# Patient Record
Sex: Female | Born: 1965 | Race: White | Hispanic: No | Marital: Married | Smoking: Former smoker
Health system: Southern US, Community
[De-identification: ages and names within clinical notes are randomized; demographics above are authoritative.]

## PROBLEM LIST (undated history)

## (undated) DIAGNOSIS — M1990 Unspecified osteoarthritis, unspecified site: Secondary | ICD-10-CM

## (undated) DIAGNOSIS — M7918 Myalgia, other site: Secondary | ICD-10-CM

## (undated) DIAGNOSIS — Z8719 Personal history of other diseases of the digestive system: Secondary | ICD-10-CM

## (undated) DIAGNOSIS — K219 Gastro-esophageal reflux disease without esophagitis: Secondary | ICD-10-CM

## (undated) DIAGNOSIS — D259 Leiomyoma of uterus, unspecified: Secondary | ICD-10-CM

## (undated) DIAGNOSIS — F419 Anxiety disorder, unspecified: Secondary | ICD-10-CM

## (undated) DIAGNOSIS — T7840XA Allergy, unspecified, initial encounter: Secondary | ICD-10-CM

## (undated) DIAGNOSIS — I10 Essential (primary) hypertension: Secondary | ICD-10-CM

## (undated) DIAGNOSIS — N3010 Interstitial cystitis (chronic) without hematuria: Secondary | ICD-10-CM

## (undated) DIAGNOSIS — M329 Systemic lupus erythematosus, unspecified: Secondary | ICD-10-CM

## (undated) DIAGNOSIS — IMO0002 Reserved for concepts with insufficient information to code with codable children: Secondary | ICD-10-CM

## (undated) DIAGNOSIS — M797 Fibromyalgia: Secondary | ICD-10-CM

## (undated) DIAGNOSIS — E785 Hyperlipidemia, unspecified: Secondary | ICD-10-CM

## (undated) HISTORY — DX: Hyperlipidemia, unspecified: E78.5

## (undated) HISTORY — PX: ABDOMINAL HYSTERECTOMY: SHX81

## (undated) HISTORY — DX: Allergy, unspecified, initial encounter: T78.40XA

## (undated) HISTORY — PX: FRACTURE SURGERY: SHX138

## (undated) HISTORY — PX: WISDOM TOOTH EXTRACTION: SHX21

## (undated) HISTORY — PX: COLONOSCOPY WITH ESOPHAGOGASTRODUODENOSCOPY (EGD): SHX5779

## (undated) HISTORY — PX: CHOLECYSTECTOMY: SHX55

## (undated) HISTORY — PX: THUMB ARTHROSCOPY: SHX2509

## (undated) HISTORY — PX: ULNAR SHORTENING WITH BONE GRAFT: SHX6330

---

## 2011-12-29 DIAGNOSIS — E559 Vitamin D deficiency, unspecified: Secondary | ICD-10-CM | POA: Insufficient documentation

## 2014-09-01 DIAGNOSIS — M329 Systemic lupus erythematosus, unspecified: Secondary | ICD-10-CM | POA: Insufficient documentation

## 2014-09-01 DIAGNOSIS — M797 Fibromyalgia: Secondary | ICD-10-CM | POA: Insufficient documentation

## 2014-09-01 DIAGNOSIS — M159 Polyosteoarthritis, unspecified: Secondary | ICD-10-CM | POA: Insufficient documentation

## 2014-09-01 DIAGNOSIS — L719 Rosacea, unspecified: Secondary | ICD-10-CM | POA: Insufficient documentation

## 2016-11-01 DIAGNOSIS — M21931 Unspecified acquired deformity of right forearm: Secondary | ICD-10-CM | POA: Diagnosis not present

## 2016-11-01 DIAGNOSIS — M85441 Solitary bone cyst, right hand: Secondary | ICD-10-CM | POA: Diagnosis not present

## 2016-11-01 DIAGNOSIS — M24831 Other specific joint derangements of right wrist, not elsewhere classified: Secondary | ICD-10-CM | POA: Diagnosis not present

## 2016-11-01 DIAGNOSIS — M659 Synovitis and tenosynovitis, unspecified: Secondary | ICD-10-CM | POA: Diagnosis not present

## 2016-11-01 DIAGNOSIS — M67431 Ganglion, right wrist: Secondary | ICD-10-CM | POA: Diagnosis not present

## 2016-11-01 DIAGNOSIS — M21831 Other specified acquired deformities of right forearm: Secondary | ICD-10-CM | POA: Diagnosis not present

## 2016-11-01 DIAGNOSIS — M24131 Other articular cartilage disorders, right wrist: Secondary | ICD-10-CM | POA: Diagnosis not present

## 2016-11-01 DIAGNOSIS — M65841 Other synovitis and tenosynovitis, right hand: Secondary | ICD-10-CM | POA: Diagnosis not present

## 2016-11-01 DIAGNOSIS — S63591D Other specified sprain of right wrist, subsequent encounter: Secondary | ICD-10-CM | POA: Diagnosis not present

## 2016-11-01 DIAGNOSIS — G8918 Other acute postprocedural pain: Secondary | ICD-10-CM | POA: Diagnosis not present

## 2016-11-01 DIAGNOSIS — M25331 Other instability, right wrist: Secondary | ICD-10-CM | POA: Diagnosis not present

## 2016-11-14 DIAGNOSIS — M65841 Other synovitis and tenosynovitis, right hand: Secondary | ICD-10-CM | POA: Diagnosis not present

## 2016-11-14 DIAGNOSIS — Z4789 Encounter for other orthopedic aftercare: Secondary | ICD-10-CM | POA: Diagnosis not present

## 2016-11-14 DIAGNOSIS — M24831 Other specific joint derangements of right wrist, not elsewhere classified: Secondary | ICD-10-CM | POA: Diagnosis not present

## 2016-11-17 DIAGNOSIS — M3210 Systemic lupus erythematosus, organ or system involvement unspecified: Secondary | ICD-10-CM | POA: Diagnosis not present

## 2016-11-17 DIAGNOSIS — Z79899 Other long term (current) drug therapy: Secondary | ICD-10-CM | POA: Diagnosis not present

## 2016-11-28 DIAGNOSIS — Z4789 Encounter for other orthopedic aftercare: Secondary | ICD-10-CM | POA: Diagnosis not present

## 2016-11-28 DIAGNOSIS — M25831 Other specified joint disorders, right wrist: Secondary | ICD-10-CM | POA: Diagnosis not present

## 2016-11-28 DIAGNOSIS — M65841 Other synovitis and tenosynovitis, right hand: Secondary | ICD-10-CM | POA: Diagnosis not present

## 2016-12-05 DIAGNOSIS — I10 Essential (primary) hypertension: Secondary | ICD-10-CM | POA: Diagnosis not present

## 2016-12-05 DIAGNOSIS — M25831 Other specified joint disorders, right wrist: Secondary | ICD-10-CM | POA: Diagnosis not present

## 2016-12-05 DIAGNOSIS — E782 Mixed hyperlipidemia: Secondary | ICD-10-CM | POA: Diagnosis not present

## 2016-12-05 DIAGNOSIS — Z130 Encounter for screening for diseases of the blood and blood-forming organs and certain disorders involving the immune mechanism: Secondary | ICD-10-CM | POA: Diagnosis not present

## 2016-12-05 DIAGNOSIS — E1165 Type 2 diabetes mellitus with hyperglycemia: Secondary | ICD-10-CM | POA: Diagnosis not present

## 2016-12-05 DIAGNOSIS — Z0189 Encounter for other specified special examinations: Secondary | ICD-10-CM | POA: Diagnosis not present

## 2016-12-05 DIAGNOSIS — Z0000 Encounter for general adult medical examination without abnormal findings: Secondary | ICD-10-CM | POA: Diagnosis not present

## 2016-12-05 DIAGNOSIS — E790 Hyperuricemia without signs of inflammatory arthritis and tophaceous disease: Secondary | ICD-10-CM | POA: Diagnosis not present

## 2016-12-05 DIAGNOSIS — E559 Vitamin D deficiency, unspecified: Secondary | ICD-10-CM | POA: Diagnosis not present

## 2016-12-05 DIAGNOSIS — E039 Hypothyroidism, unspecified: Secondary | ICD-10-CM | POA: Diagnosis not present

## 2016-12-14 DIAGNOSIS — M25831 Other specified joint disorders, right wrist: Secondary | ICD-10-CM | POA: Diagnosis not present

## 2016-12-19 DIAGNOSIS — M25831 Other specified joint disorders, right wrist: Secondary | ICD-10-CM | POA: Diagnosis not present

## 2016-12-22 DIAGNOSIS — M25831 Other specified joint disorders, right wrist: Secondary | ICD-10-CM | POA: Diagnosis not present

## 2016-12-29 DIAGNOSIS — M25831 Other specified joint disorders, right wrist: Secondary | ICD-10-CM | POA: Diagnosis not present

## 2017-01-06 DIAGNOSIS — S52691K Other fracture of lower end of right ulna, subsequent encounter for closed fracture with nonunion: Secondary | ICD-10-CM | POA: Diagnosis not present

## 2017-01-11 DIAGNOSIS — M25831 Other specified joint disorders, right wrist: Secondary | ICD-10-CM | POA: Diagnosis not present

## 2017-01-18 DIAGNOSIS — M25831 Other specified joint disorders, right wrist: Secondary | ICD-10-CM | POA: Diagnosis not present

## 2017-01-24 DIAGNOSIS — M797 Fibromyalgia: Secondary | ICD-10-CM | POA: Diagnosis not present

## 2017-01-24 DIAGNOSIS — M25531 Pain in right wrist: Secondary | ICD-10-CM | POA: Diagnosis not present

## 2017-01-24 DIAGNOSIS — M25431 Effusion, right wrist: Secondary | ICD-10-CM | POA: Diagnosis not present

## 2017-01-24 DIAGNOSIS — J302 Other seasonal allergic rhinitis: Secondary | ICD-10-CM | POA: Diagnosis not present

## 2017-01-24 DIAGNOSIS — M6281 Muscle weakness (generalized): Secondary | ICD-10-CM | POA: Diagnosis not present

## 2017-01-24 DIAGNOSIS — M791 Myalgia: Secondary | ICD-10-CM | POA: Diagnosis not present

## 2017-01-24 DIAGNOSIS — M25631 Stiffness of right wrist, not elsewhere classified: Secondary | ICD-10-CM | POA: Diagnosis not present

## 2017-01-24 DIAGNOSIS — M328 Other forms of systemic lupus erythematosus: Secondary | ICD-10-CM | POA: Diagnosis not present

## 2017-01-27 DIAGNOSIS — M25631 Stiffness of right wrist, not elsewhere classified: Secondary | ICD-10-CM | POA: Diagnosis not present

## 2017-01-27 DIAGNOSIS — M25431 Effusion, right wrist: Secondary | ICD-10-CM | POA: Diagnosis not present

## 2017-01-27 DIAGNOSIS — M25531 Pain in right wrist: Secondary | ICD-10-CM | POA: Diagnosis not present

## 2017-01-27 DIAGNOSIS — M6281 Muscle weakness (generalized): Secondary | ICD-10-CM | POA: Diagnosis not present

## 2017-02-03 DIAGNOSIS — M25631 Stiffness of right wrist, not elsewhere classified: Secondary | ICD-10-CM | POA: Diagnosis not present

## 2017-02-03 DIAGNOSIS — M25531 Pain in right wrist: Secondary | ICD-10-CM | POA: Diagnosis not present

## 2017-02-03 DIAGNOSIS — M25431 Effusion, right wrist: Secondary | ICD-10-CM | POA: Diagnosis not present

## 2017-02-03 DIAGNOSIS — M6281 Muscle weakness (generalized): Secondary | ICD-10-CM | POA: Diagnosis not present

## 2017-02-07 DIAGNOSIS — M25631 Stiffness of right wrist, not elsewhere classified: Secondary | ICD-10-CM | POA: Diagnosis not present

## 2017-02-07 DIAGNOSIS — M6281 Muscle weakness (generalized): Secondary | ICD-10-CM | POA: Diagnosis not present

## 2017-02-07 DIAGNOSIS — M25431 Effusion, right wrist: Secondary | ICD-10-CM | POA: Diagnosis not present

## 2017-02-07 DIAGNOSIS — M25531 Pain in right wrist: Secondary | ICD-10-CM | POA: Diagnosis not present

## 2017-02-09 DIAGNOSIS — M6281 Muscle weakness (generalized): Secondary | ICD-10-CM | POA: Diagnosis not present

## 2017-02-09 DIAGNOSIS — M25631 Stiffness of right wrist, not elsewhere classified: Secondary | ICD-10-CM | POA: Diagnosis not present

## 2017-02-09 DIAGNOSIS — M25431 Effusion, right wrist: Secondary | ICD-10-CM | POA: Diagnosis not present

## 2017-02-09 DIAGNOSIS — M25531 Pain in right wrist: Secondary | ICD-10-CM | POA: Diagnosis not present

## 2017-02-14 DIAGNOSIS — M25631 Stiffness of right wrist, not elsewhere classified: Secondary | ICD-10-CM | POA: Diagnosis not present

## 2017-02-14 DIAGNOSIS — M6281 Muscle weakness (generalized): Secondary | ICD-10-CM | POA: Diagnosis not present

## 2017-02-14 DIAGNOSIS — M25431 Effusion, right wrist: Secondary | ICD-10-CM | POA: Diagnosis not present

## 2017-02-14 DIAGNOSIS — M25531 Pain in right wrist: Secondary | ICD-10-CM | POA: Diagnosis not present

## 2017-02-15 DIAGNOSIS — M25532 Pain in left wrist: Secondary | ICD-10-CM | POA: Diagnosis not present

## 2017-02-15 DIAGNOSIS — M25832 Other specified joint disorders, left wrist: Secondary | ICD-10-CM | POA: Diagnosis not present

## 2017-02-15 DIAGNOSIS — M25831 Other specified joint disorders, right wrist: Secondary | ICD-10-CM | POA: Diagnosis not present

## 2017-02-16 DIAGNOSIS — R1013 Epigastric pain: Secondary | ICD-10-CM | POA: Diagnosis not present

## 2017-02-16 DIAGNOSIS — M25531 Pain in right wrist: Secondary | ICD-10-CM | POA: Diagnosis not present

## 2017-02-16 DIAGNOSIS — M25431 Effusion, right wrist: Secondary | ICD-10-CM | POA: Diagnosis not present

## 2017-02-16 DIAGNOSIS — M6281 Muscle weakness (generalized): Secondary | ICD-10-CM | POA: Diagnosis not present

## 2017-02-16 DIAGNOSIS — M25631 Stiffness of right wrist, not elsewhere classified: Secondary | ICD-10-CM | POA: Diagnosis not present

## 2017-02-17 DIAGNOSIS — Z131 Encounter for screening for diabetes mellitus: Secondary | ICD-10-CM | POA: Diagnosis not present

## 2017-02-17 DIAGNOSIS — Z79899 Other long term (current) drug therapy: Secondary | ICD-10-CM | POA: Diagnosis not present

## 2017-02-17 DIAGNOSIS — M3210 Systemic lupus erythematosus, organ or system involvement unspecified: Secondary | ICD-10-CM | POA: Diagnosis not present

## 2017-02-17 DIAGNOSIS — Z13220 Encounter for screening for lipoid disorders: Secondary | ICD-10-CM | POA: Diagnosis not present

## 2017-02-21 DIAGNOSIS — M25531 Pain in right wrist: Secondary | ICD-10-CM | POA: Diagnosis not present

## 2017-02-21 DIAGNOSIS — M6281 Muscle weakness (generalized): Secondary | ICD-10-CM | POA: Diagnosis not present

## 2017-02-21 DIAGNOSIS — M25431 Effusion, right wrist: Secondary | ICD-10-CM | POA: Diagnosis not present

## 2017-02-21 DIAGNOSIS — M25631 Stiffness of right wrist, not elsewhere classified: Secondary | ICD-10-CM | POA: Diagnosis not present

## 2017-02-22 ENCOUNTER — Other Ambulatory Visit: Payer: Self-pay | Admitting: Internal Medicine

## 2017-02-22 DIAGNOSIS — Z1231 Encounter for screening mammogram for malignant neoplasm of breast: Secondary | ICD-10-CM

## 2017-02-23 DIAGNOSIS — M6281 Muscle weakness (generalized): Secondary | ICD-10-CM | POA: Diagnosis not present

## 2017-02-23 DIAGNOSIS — M25431 Effusion, right wrist: Secondary | ICD-10-CM | POA: Diagnosis not present

## 2017-02-23 DIAGNOSIS — M25531 Pain in right wrist: Secondary | ICD-10-CM | POA: Diagnosis not present

## 2017-02-23 DIAGNOSIS — M25631 Stiffness of right wrist, not elsewhere classified: Secondary | ICD-10-CM | POA: Diagnosis not present

## 2017-03-01 DIAGNOSIS — E2839 Other primary ovarian failure: Secondary | ICD-10-CM | POA: Diagnosis not present

## 2017-03-01 DIAGNOSIS — I781 Nevus, non-neoplastic: Secondary | ICD-10-CM | POA: Diagnosis not present

## 2017-03-01 DIAGNOSIS — L578 Other skin changes due to chronic exposure to nonionizing radiation: Secondary | ICD-10-CM | POA: Diagnosis not present

## 2017-03-01 DIAGNOSIS — M3210 Systemic lupus erythematosus, organ or system involvement unspecified: Secondary | ICD-10-CM | POA: Diagnosis not present

## 2017-03-01 DIAGNOSIS — L930 Discoid lupus erythematosus: Secondary | ICD-10-CM | POA: Diagnosis not present

## 2017-03-01 DIAGNOSIS — Z7952 Long term (current) use of systemic steroids: Secondary | ICD-10-CM | POA: Diagnosis not present

## 2017-03-02 DIAGNOSIS — M25631 Stiffness of right wrist, not elsewhere classified: Secondary | ICD-10-CM | POA: Diagnosis not present

## 2017-03-02 DIAGNOSIS — M25431 Effusion, right wrist: Secondary | ICD-10-CM | POA: Diagnosis not present

## 2017-03-02 DIAGNOSIS — M6281 Muscle weakness (generalized): Secondary | ICD-10-CM | POA: Diagnosis not present

## 2017-03-02 DIAGNOSIS — M25531 Pain in right wrist: Secondary | ICD-10-CM | POA: Diagnosis not present

## 2017-03-03 ENCOUNTER — Ambulatory Visit: Payer: Self-pay

## 2017-03-07 DIAGNOSIS — M6281 Muscle weakness (generalized): Secondary | ICD-10-CM | POA: Diagnosis not present

## 2017-03-07 DIAGNOSIS — M25431 Effusion, right wrist: Secondary | ICD-10-CM | POA: Diagnosis not present

## 2017-03-07 DIAGNOSIS — M25531 Pain in right wrist: Secondary | ICD-10-CM | POA: Diagnosis not present

## 2017-03-07 DIAGNOSIS — M25631 Stiffness of right wrist, not elsewhere classified: Secondary | ICD-10-CM | POA: Diagnosis not present

## 2017-03-08 ENCOUNTER — Ambulatory Visit
Admission: RE | Admit: 2017-03-08 | Discharge: 2017-03-08 | Disposition: A | Discharge status: Home / Self Care | Payer: BLUE CROSS/BLUE SHIELD | Source: Ambulatory Visit | Attending: Internal Medicine | Admitting: Internal Medicine

## 2017-03-08 DIAGNOSIS — Z1231 Encounter for screening mammogram for malignant neoplasm of breast: Secondary | ICD-10-CM

## 2017-03-09 DIAGNOSIS — M25431 Effusion, right wrist: Secondary | ICD-10-CM | POA: Diagnosis not present

## 2017-03-09 DIAGNOSIS — M6281 Muscle weakness (generalized): Secondary | ICD-10-CM | POA: Diagnosis not present

## 2017-03-09 DIAGNOSIS — M25631 Stiffness of right wrist, not elsewhere classified: Secondary | ICD-10-CM | POA: Diagnosis not present

## 2017-03-09 DIAGNOSIS — M25531 Pain in right wrist: Secondary | ICD-10-CM | POA: Diagnosis not present

## 2017-03-10 DIAGNOSIS — K210 Gastro-esophageal reflux disease with esophagitis: Secondary | ICD-10-CM | POA: Diagnosis not present

## 2017-03-10 DIAGNOSIS — M1990 Unspecified osteoarthritis, unspecified site: Secondary | ICD-10-CM | POA: Diagnosis not present

## 2017-03-10 DIAGNOSIS — K222 Esophageal obstruction: Secondary | ICD-10-CM | POA: Diagnosis not present

## 2017-03-10 DIAGNOSIS — N3010 Interstitial cystitis (chronic) without hematuria: Secondary | ICD-10-CM | POA: Diagnosis not present

## 2017-03-10 DIAGNOSIS — M797 Fibromyalgia: Secondary | ICD-10-CM | POA: Diagnosis not present

## 2017-03-10 DIAGNOSIS — M329 Systemic lupus erythematosus, unspecified: Secondary | ICD-10-CM | POA: Diagnosis not present

## 2017-03-10 DIAGNOSIS — R12 Heartburn: Secondary | ICD-10-CM | POA: Diagnosis not present

## 2017-03-10 DIAGNOSIS — R1011 Right upper quadrant pain: Secondary | ICD-10-CM | POA: Diagnosis not present

## 2017-03-10 DIAGNOSIS — I10 Essential (primary) hypertension: Secondary | ICD-10-CM | POA: Diagnosis not present

## 2017-03-10 DIAGNOSIS — K208 Other esophagitis: Secondary | ICD-10-CM | POA: Diagnosis not present

## 2017-03-10 DIAGNOSIS — K219 Gastro-esophageal reflux disease without esophagitis: Secondary | ICD-10-CM | POA: Diagnosis not present

## 2017-03-10 DIAGNOSIS — Z90710 Acquired absence of both cervix and uterus: Secondary | ICD-10-CM | POA: Diagnosis not present

## 2017-03-10 DIAGNOSIS — Z79899 Other long term (current) drug therapy: Secondary | ICD-10-CM | POA: Diagnosis not present

## 2017-03-10 DIAGNOSIS — L719 Rosacea, unspecified: Secondary | ICD-10-CM | POA: Diagnosis not present

## 2017-03-10 DIAGNOSIS — K449 Diaphragmatic hernia without obstruction or gangrene: Secondary | ICD-10-CM | POA: Diagnosis not present

## 2017-03-14 DIAGNOSIS — M25431 Effusion, right wrist: Secondary | ICD-10-CM | POA: Diagnosis not present

## 2017-03-14 DIAGNOSIS — M6281 Muscle weakness (generalized): Secondary | ICD-10-CM | POA: Diagnosis not present

## 2017-03-14 DIAGNOSIS — M25631 Stiffness of right wrist, not elsewhere classified: Secondary | ICD-10-CM | POA: Diagnosis not present

## 2017-03-14 DIAGNOSIS — M25531 Pain in right wrist: Secondary | ICD-10-CM | POA: Diagnosis not present

## 2017-03-21 DIAGNOSIS — M25631 Stiffness of right wrist, not elsewhere classified: Secondary | ICD-10-CM | POA: Diagnosis not present

## 2017-03-21 DIAGNOSIS — M25431 Effusion, right wrist: Secondary | ICD-10-CM | POA: Diagnosis not present

## 2017-03-21 DIAGNOSIS — M25531 Pain in right wrist: Secondary | ICD-10-CM | POA: Diagnosis not present

## 2017-03-21 DIAGNOSIS — M6281 Muscle weakness (generalized): Secondary | ICD-10-CM | POA: Diagnosis not present

## 2017-03-23 DIAGNOSIS — M25531 Pain in right wrist: Secondary | ICD-10-CM | POA: Diagnosis not present

## 2017-03-23 DIAGNOSIS — M25631 Stiffness of right wrist, not elsewhere classified: Secondary | ICD-10-CM | POA: Diagnosis not present

## 2017-03-23 DIAGNOSIS — M6281 Muscle weakness (generalized): Secondary | ICD-10-CM | POA: Diagnosis not present

## 2017-03-23 DIAGNOSIS — M25431 Effusion, right wrist: Secondary | ICD-10-CM | POA: Diagnosis not present

## 2017-03-28 DIAGNOSIS — M6281 Muscle weakness (generalized): Secondary | ICD-10-CM | POA: Diagnosis not present

## 2017-03-28 DIAGNOSIS — M25431 Effusion, right wrist: Secondary | ICD-10-CM | POA: Diagnosis not present

## 2017-03-28 DIAGNOSIS — M25531 Pain in right wrist: Secondary | ICD-10-CM | POA: Diagnosis not present

## 2017-03-28 DIAGNOSIS — M25631 Stiffness of right wrist, not elsewhere classified: Secondary | ICD-10-CM | POA: Diagnosis not present

## 2017-03-30 DIAGNOSIS — M25631 Stiffness of right wrist, not elsewhere classified: Secondary | ICD-10-CM | POA: Diagnosis not present

## 2017-03-30 DIAGNOSIS — M25431 Effusion, right wrist: Secondary | ICD-10-CM | POA: Diagnosis not present

## 2017-03-30 DIAGNOSIS — M6281 Muscle weakness (generalized): Secondary | ICD-10-CM | POA: Diagnosis not present

## 2017-03-30 DIAGNOSIS — M25531 Pain in right wrist: Secondary | ICD-10-CM | POA: Diagnosis not present

## 2017-04-03 DIAGNOSIS — M25531 Pain in right wrist: Secondary | ICD-10-CM | POA: Diagnosis not present

## 2017-04-03 DIAGNOSIS — M25431 Effusion, right wrist: Secondary | ICD-10-CM | POA: Diagnosis not present

## 2017-04-03 DIAGNOSIS — M6281 Muscle weakness (generalized): Secondary | ICD-10-CM | POA: Diagnosis not present

## 2017-04-03 DIAGNOSIS — M25631 Stiffness of right wrist, not elsewhere classified: Secondary | ICD-10-CM | POA: Diagnosis not present

## 2017-04-04 ENCOUNTER — Other Ambulatory Visit: Payer: Self-pay | Admitting: Orthopedic Surgery

## 2017-04-04 DIAGNOSIS — M21832 Other specified acquired deformities of left forearm: Secondary | ICD-10-CM | POA: Diagnosis not present

## 2017-04-04 DIAGNOSIS — M7989 Other specified soft tissue disorders: Secondary | ICD-10-CM | POA: Diagnosis not present

## 2017-04-04 DIAGNOSIS — G8918 Other acute postprocedural pain: Secondary | ICD-10-CM | POA: Diagnosis not present

## 2017-04-04 DIAGNOSIS — M24832 Other specific joint derangements of left wrist, not elsewhere classified: Secondary | ICD-10-CM | POA: Diagnosis not present

## 2017-04-04 DIAGNOSIS — M24132 Other articular cartilage disorders, left wrist: Secondary | ICD-10-CM | POA: Diagnosis not present

## 2017-04-04 DIAGNOSIS — M85642 Other cyst of bone, left hand: Secondary | ICD-10-CM | POA: Diagnosis not present

## 2017-04-04 DIAGNOSIS — M65842 Other synovitis and tenosynovitis, left hand: Secondary | ICD-10-CM | POA: Diagnosis not present

## 2017-04-17 DIAGNOSIS — M25832 Other specified joint disorders, left wrist: Secondary | ICD-10-CM | POA: Diagnosis not present

## 2017-04-20 DIAGNOSIS — M25631 Stiffness of right wrist, not elsewhere classified: Secondary | ICD-10-CM | POA: Diagnosis not present

## 2017-04-20 DIAGNOSIS — M25431 Effusion, right wrist: Secondary | ICD-10-CM | POA: Diagnosis not present

## 2017-04-20 DIAGNOSIS — M6281 Muscle weakness (generalized): Secondary | ICD-10-CM | POA: Diagnosis not present

## 2017-04-20 DIAGNOSIS — M25531 Pain in right wrist: Secondary | ICD-10-CM | POA: Diagnosis not present

## 2017-04-26 DIAGNOSIS — M25832 Other specified joint disorders, left wrist: Secondary | ICD-10-CM | POA: Diagnosis not present

## 2017-05-17 DIAGNOSIS — M25832 Other specified joint disorders, left wrist: Secondary | ICD-10-CM | POA: Diagnosis not present

## 2017-05-18 ENCOUNTER — Other Ambulatory Visit: Payer: Self-pay | Admitting: Gastroenterology

## 2017-05-18 DIAGNOSIS — R1011 Right upper quadrant pain: Secondary | ICD-10-CM | POA: Diagnosis not present

## 2017-05-18 DIAGNOSIS — K219 Gastro-esophageal reflux disease without esophagitis: Secondary | ICD-10-CM

## 2017-05-23 ENCOUNTER — Other Ambulatory Visit: Payer: BLUE CROSS/BLUE SHIELD

## 2017-06-01 DIAGNOSIS — M6281 Muscle weakness (generalized): Secondary | ICD-10-CM | POA: Diagnosis not present

## 2017-06-01 DIAGNOSIS — M25432 Effusion, left wrist: Secondary | ICD-10-CM | POA: Diagnosis not present

## 2017-06-01 DIAGNOSIS — M25632 Stiffness of left wrist, not elsewhere classified: Secondary | ICD-10-CM | POA: Diagnosis not present

## 2017-06-01 DIAGNOSIS — M25532 Pain in left wrist: Secondary | ICD-10-CM | POA: Diagnosis not present

## 2017-06-02 ENCOUNTER — Other Ambulatory Visit: Payer: BLUE CROSS/BLUE SHIELD

## 2017-06-07 DIAGNOSIS — M25432 Effusion, left wrist: Secondary | ICD-10-CM | POA: Diagnosis not present

## 2017-06-07 DIAGNOSIS — M6281 Muscle weakness (generalized): Secondary | ICD-10-CM | POA: Diagnosis not present

## 2017-06-07 DIAGNOSIS — M25532 Pain in left wrist: Secondary | ICD-10-CM | POA: Diagnosis not present

## 2017-06-07 DIAGNOSIS — M25632 Stiffness of left wrist, not elsewhere classified: Secondary | ICD-10-CM | POA: Diagnosis not present

## 2017-06-08 DIAGNOSIS — K219 Gastro-esophageal reflux disease without esophagitis: Secondary | ICD-10-CM | POA: Diagnosis not present

## 2017-06-08 DIAGNOSIS — R1013 Epigastric pain: Secondary | ICD-10-CM | POA: Diagnosis not present

## 2017-06-08 DIAGNOSIS — K449 Diaphragmatic hernia without obstruction or gangrene: Secondary | ICD-10-CM | POA: Diagnosis not present

## 2017-06-08 DIAGNOSIS — M3210 Systemic lupus erythematosus, organ or system involvement unspecified: Secondary | ICD-10-CM | POA: Diagnosis not present

## 2017-06-09 ENCOUNTER — Other Ambulatory Visit: Payer: BLUE CROSS/BLUE SHIELD

## 2017-06-14 DIAGNOSIS — M25832 Other specified joint disorders, left wrist: Secondary | ICD-10-CM | POA: Diagnosis not present

## 2017-06-15 DIAGNOSIS — M25632 Stiffness of left wrist, not elsewhere classified: Secondary | ICD-10-CM | POA: Diagnosis not present

## 2017-06-15 DIAGNOSIS — M25532 Pain in left wrist: Secondary | ICD-10-CM | POA: Diagnosis not present

## 2017-06-15 DIAGNOSIS — M25432 Effusion, left wrist: Secondary | ICD-10-CM | POA: Diagnosis not present

## 2017-06-15 DIAGNOSIS — M6281 Muscle weakness (generalized): Secondary | ICD-10-CM | POA: Diagnosis not present

## 2017-06-17 DIAGNOSIS — R1013 Epigastric pain: Secondary | ICD-10-CM | POA: Diagnosis not present

## 2017-06-17 DIAGNOSIS — R109 Unspecified abdominal pain: Secondary | ICD-10-CM | POA: Diagnosis not present

## 2017-06-21 DIAGNOSIS — Z01419 Encounter for gynecological examination (general) (routine) without abnormal findings: Secondary | ICD-10-CM | POA: Diagnosis not present

## 2017-06-22 DIAGNOSIS — M25432 Effusion, left wrist: Secondary | ICD-10-CM | POA: Diagnosis not present

## 2017-06-22 DIAGNOSIS — M25532 Pain in left wrist: Secondary | ICD-10-CM | POA: Diagnosis not present

## 2017-06-22 DIAGNOSIS — M6281 Muscle weakness (generalized): Secondary | ICD-10-CM | POA: Diagnosis not present

## 2017-06-22 DIAGNOSIS — M25632 Stiffness of left wrist, not elsewhere classified: Secondary | ICD-10-CM | POA: Diagnosis not present

## 2017-06-27 DIAGNOSIS — M25632 Stiffness of left wrist, not elsewhere classified: Secondary | ICD-10-CM | POA: Diagnosis not present

## 2017-06-27 DIAGNOSIS — M6281 Muscle weakness (generalized): Secondary | ICD-10-CM | POA: Diagnosis not present

## 2017-06-27 DIAGNOSIS — M25532 Pain in left wrist: Secondary | ICD-10-CM | POA: Diagnosis not present

## 2017-06-27 DIAGNOSIS — M25432 Effusion, left wrist: Secondary | ICD-10-CM | POA: Diagnosis not present

## 2017-07-03 DIAGNOSIS — F4320 Adjustment disorder, unspecified: Secondary | ICD-10-CM | POA: Diagnosis not present

## 2017-07-05 DIAGNOSIS — R109 Unspecified abdominal pain: Secondary | ICD-10-CM | POA: Diagnosis not present

## 2017-07-05 DIAGNOSIS — R1013 Epigastric pain: Secondary | ICD-10-CM | POA: Diagnosis not present

## 2017-07-06 DIAGNOSIS — E559 Vitamin D deficiency, unspecified: Secondary | ICD-10-CM | POA: Diagnosis not present

## 2017-07-06 DIAGNOSIS — R5383 Other fatigue: Secondary | ICD-10-CM | POA: Diagnosis not present

## 2017-07-06 DIAGNOSIS — Z79899 Other long term (current) drug therapy: Secondary | ICD-10-CM | POA: Diagnosis not present

## 2017-07-06 DIAGNOSIS — M3210 Systemic lupus erythematosus, organ or system involvement unspecified: Secondary | ICD-10-CM | POA: Diagnosis not present

## 2017-07-12 DIAGNOSIS — M25831 Other specified joint disorders, right wrist: Secondary | ICD-10-CM | POA: Diagnosis not present

## 2017-07-12 DIAGNOSIS — M25832 Other specified joint disorders, left wrist: Secondary | ICD-10-CM | POA: Diagnosis not present

## 2017-07-19 DIAGNOSIS — R1011 Right upper quadrant pain: Secondary | ICD-10-CM | POA: Diagnosis not present

## 2017-07-19 DIAGNOSIS — M6281 Muscle weakness (generalized): Secondary | ICD-10-CM | POA: Diagnosis not present

## 2017-07-19 DIAGNOSIS — M25632 Stiffness of left wrist, not elsewhere classified: Secondary | ICD-10-CM | POA: Diagnosis not present

## 2017-07-19 DIAGNOSIS — M25432 Effusion, left wrist: Secondary | ICD-10-CM | POA: Diagnosis not present

## 2017-07-19 DIAGNOSIS — M25532 Pain in left wrist: Secondary | ICD-10-CM | POA: Diagnosis not present

## 2017-07-21 DIAGNOSIS — H353111 Nonexudative age-related macular degeneration, right eye, early dry stage: Secondary | ICD-10-CM | POA: Diagnosis not present

## 2017-07-21 DIAGNOSIS — H52221 Regular astigmatism, right eye: Secondary | ICD-10-CM | POA: Diagnosis not present

## 2017-07-27 DIAGNOSIS — M6281 Muscle weakness (generalized): Secondary | ICD-10-CM | POA: Diagnosis not present

## 2017-07-27 DIAGNOSIS — M25532 Pain in left wrist: Secondary | ICD-10-CM | POA: Diagnosis not present

## 2017-07-27 DIAGNOSIS — M25432 Effusion, left wrist: Secondary | ICD-10-CM | POA: Diagnosis not present

## 2017-07-27 DIAGNOSIS — M25632 Stiffness of left wrist, not elsewhere classified: Secondary | ICD-10-CM | POA: Diagnosis not present

## 2017-07-28 DIAGNOSIS — F4320 Adjustment disorder, unspecified: Secondary | ICD-10-CM | POA: Diagnosis not present

## 2017-08-02 DIAGNOSIS — M25632 Stiffness of left wrist, not elsewhere classified: Secondary | ICD-10-CM | POA: Diagnosis not present

## 2017-08-02 DIAGNOSIS — M6281 Muscle weakness (generalized): Secondary | ICD-10-CM | POA: Diagnosis not present

## 2017-08-02 DIAGNOSIS — M25432 Effusion, left wrist: Secondary | ICD-10-CM | POA: Diagnosis not present

## 2017-08-02 DIAGNOSIS — M25532 Pain in left wrist: Secondary | ICD-10-CM | POA: Diagnosis not present

## 2017-08-03 DIAGNOSIS — F4320 Adjustment disorder, unspecified: Secondary | ICD-10-CM | POA: Diagnosis not present

## 2017-08-07 DIAGNOSIS — K660 Peritoneal adhesions (postprocedural) (postinfection): Secondary | ICD-10-CM | POA: Diagnosis not present

## 2017-08-07 DIAGNOSIS — R1011 Right upper quadrant pain: Secondary | ICD-10-CM | POA: Diagnosis not present

## 2017-08-07 DIAGNOSIS — K811 Chronic cholecystitis: Secondary | ICD-10-CM | POA: Diagnosis not present

## 2017-08-07 DIAGNOSIS — Z01818 Encounter for other preprocedural examination: Secondary | ICD-10-CM | POA: Diagnosis not present

## 2017-08-07 DIAGNOSIS — Z01810 Encounter for preprocedural cardiovascular examination: Secondary | ICD-10-CM | POA: Diagnosis not present

## 2017-08-07 DIAGNOSIS — Z87891 Personal history of nicotine dependence: Secondary | ICD-10-CM | POA: Diagnosis not present

## 2017-08-07 DIAGNOSIS — Z79899 Other long term (current) drug therapy: Secondary | ICD-10-CM | POA: Diagnosis not present

## 2017-08-07 DIAGNOSIS — M329 Systemic lupus erythematosus, unspecified: Secondary | ICD-10-CM | POA: Diagnosis not present

## 2017-08-14 DIAGNOSIS — F4320 Adjustment disorder, unspecified: Secondary | ICD-10-CM | POA: Diagnosis not present

## 2017-08-21 DIAGNOSIS — F4320 Adjustment disorder, unspecified: Secondary | ICD-10-CM | POA: Diagnosis not present

## 2017-08-25 DIAGNOSIS — K449 Diaphragmatic hernia without obstruction or gangrene: Secondary | ICD-10-CM | POA: Diagnosis not present

## 2017-08-25 DIAGNOSIS — K219 Gastro-esophageal reflux disease without esophagitis: Secondary | ICD-10-CM | POA: Diagnosis not present

## 2017-09-06 DIAGNOSIS — M329 Systemic lupus erythematosus, unspecified: Secondary | ICD-10-CM | POA: Diagnosis not present

## 2017-09-06 DIAGNOSIS — K449 Diaphragmatic hernia without obstruction or gangrene: Secondary | ICD-10-CM | POA: Diagnosis not present

## 2017-09-06 DIAGNOSIS — Z87891 Personal history of nicotine dependence: Secondary | ICD-10-CM | POA: Diagnosis not present

## 2017-09-06 DIAGNOSIS — K228 Other specified diseases of esophagus: Secondary | ICD-10-CM | POA: Diagnosis not present

## 2017-09-06 DIAGNOSIS — M1990 Unspecified osteoarthritis, unspecified site: Secondary | ICD-10-CM | POA: Diagnosis not present

## 2017-09-06 DIAGNOSIS — R1310 Dysphagia, unspecified: Secondary | ICD-10-CM | POA: Diagnosis not present

## 2017-09-06 DIAGNOSIS — K210 Gastro-esophageal reflux disease with esophagitis: Secondary | ICD-10-CM | POA: Diagnosis not present

## 2017-09-06 DIAGNOSIS — Z79899 Other long term (current) drug therapy: Secondary | ICD-10-CM | POA: Diagnosis not present

## 2017-09-06 DIAGNOSIS — K222 Esophageal obstruction: Secondary | ICD-10-CM | POA: Diagnosis not present

## 2017-09-11 DIAGNOSIS — G950 Syringomyelia and syringobulbia: Secondary | ICD-10-CM | POA: Diagnosis not present

## 2017-09-11 DIAGNOSIS — M47814 Spondylosis without myelopathy or radiculopathy, thoracic region: Secondary | ICD-10-CM | POA: Diagnosis not present

## 2017-09-13 DIAGNOSIS — M8560 Other cyst of bone, unspecified site: Secondary | ICD-10-CM | POA: Diagnosis not present

## 2017-09-13 DIAGNOSIS — M25832 Other specified joint disorders, left wrist: Secondary | ICD-10-CM | POA: Diagnosis not present

## 2017-10-10 DIAGNOSIS — M3210 Systemic lupus erythematosus, organ or system involvement unspecified: Secondary | ICD-10-CM | POA: Diagnosis not present

## 2017-10-10 DIAGNOSIS — Z79899 Other long term (current) drug therapy: Secondary | ICD-10-CM | POA: Diagnosis not present

## 2017-12-09 DIAGNOSIS — Z0000 Encounter for general adult medical examination without abnormal findings: Secondary | ICD-10-CM | POA: Diagnosis not present

## 2017-12-19 DIAGNOSIS — M797 Fibromyalgia: Secondary | ICD-10-CM | POA: Diagnosis not present

## 2017-12-19 DIAGNOSIS — M3210 Systemic lupus erythematosus, organ or system involvement unspecified: Secondary | ICD-10-CM | POA: Diagnosis not present

## 2017-12-19 DIAGNOSIS — N3010 Interstitial cystitis (chronic) without hematuria: Secondary | ICD-10-CM | POA: Diagnosis not present

## 2017-12-19 DIAGNOSIS — E7800 Pure hypercholesterolemia, unspecified: Secondary | ICD-10-CM | POA: Diagnosis not present

## 2018-01-04 DIAGNOSIS — I7300 Raynaud's syndrome without gangrene: Secondary | ICD-10-CM | POA: Diagnosis not present

## 2018-01-04 DIAGNOSIS — M3210 Systemic lupus erythematosus, organ or system involvement unspecified: Secondary | ICD-10-CM | POA: Diagnosis not present

## 2018-01-04 DIAGNOSIS — Z79899 Other long term (current) drug therapy: Secondary | ICD-10-CM | POA: Diagnosis not present

## 2018-01-11 DIAGNOSIS — Z79899 Other long term (current) drug therapy: Secondary | ICD-10-CM | POA: Diagnosis not present

## 2018-01-11 DIAGNOSIS — M3210 Systemic lupus erythematosus, organ or system involvement unspecified: Secondary | ICD-10-CM | POA: Diagnosis not present

## 2018-01-25 DIAGNOSIS — M3210 Systemic lupus erythematosus, organ or system involvement unspecified: Secondary | ICD-10-CM | POA: Diagnosis not present

## 2018-03-29 DIAGNOSIS — M25831 Other specified joint disorders, right wrist: Secondary | ICD-10-CM | POA: Diagnosis not present

## 2018-03-29 DIAGNOSIS — M189 Osteoarthritis of first carpometacarpal joint, unspecified: Secondary | ICD-10-CM | POA: Diagnosis not present

## 2018-03-29 DIAGNOSIS — M25832 Other specified joint disorders, left wrist: Secondary | ICD-10-CM | POA: Diagnosis not present

## 2018-03-29 DIAGNOSIS — M329 Systemic lupus erythematosus, unspecified: Secondary | ICD-10-CM | POA: Diagnosis not present

## 2018-04-04 DIAGNOSIS — M5431 Sciatica, right side: Secondary | ICD-10-CM | POA: Diagnosis not present

## 2018-04-04 DIAGNOSIS — M5489 Other dorsalgia: Secondary | ICD-10-CM | POA: Diagnosis not present

## 2018-04-04 DIAGNOSIS — M546 Pain in thoracic spine: Secondary | ICD-10-CM | POA: Diagnosis not present

## 2018-04-04 DIAGNOSIS — M542 Cervicalgia: Secondary | ICD-10-CM | POA: Diagnosis not present

## 2018-04-17 DIAGNOSIS — M546 Pain in thoracic spine: Secondary | ICD-10-CM | POA: Diagnosis not present

## 2018-04-17 DIAGNOSIS — M542 Cervicalgia: Secondary | ICD-10-CM | POA: Diagnosis not present

## 2018-04-17 DIAGNOSIS — M5489 Other dorsalgia: Secondary | ICD-10-CM | POA: Diagnosis not present

## 2018-04-17 DIAGNOSIS — M5431 Sciatica, right side: Secondary | ICD-10-CM | POA: Diagnosis not present

## 2018-04-25 DIAGNOSIS — M5489 Other dorsalgia: Secondary | ICD-10-CM | POA: Diagnosis not present

## 2018-04-25 DIAGNOSIS — M546 Pain in thoracic spine: Secondary | ICD-10-CM | POA: Diagnosis not present

## 2018-04-25 DIAGNOSIS — M5431 Sciatica, right side: Secondary | ICD-10-CM | POA: Diagnosis not present

## 2018-04-25 DIAGNOSIS — M542 Cervicalgia: Secondary | ICD-10-CM | POA: Diagnosis not present

## 2018-05-04 DIAGNOSIS — M5431 Sciatica, right side: Secondary | ICD-10-CM | POA: Diagnosis not present

## 2018-05-04 DIAGNOSIS — M546 Pain in thoracic spine: Secondary | ICD-10-CM | POA: Diagnosis not present

## 2018-05-04 DIAGNOSIS — M5489 Other dorsalgia: Secondary | ICD-10-CM | POA: Diagnosis not present

## 2018-05-04 DIAGNOSIS — M542 Cervicalgia: Secondary | ICD-10-CM | POA: Diagnosis not present

## 2018-05-08 DIAGNOSIS — M19031 Primary osteoarthritis, right wrist: Secondary | ICD-10-CM | POA: Diagnosis not present

## 2018-05-08 DIAGNOSIS — M1812 Unilateral primary osteoarthritis of first carpometacarpal joint, left hand: Secondary | ICD-10-CM | POA: Diagnosis present

## 2018-05-08 DIAGNOSIS — Z472 Encounter for removal of internal fixation device: Secondary | ICD-10-CM | POA: Diagnosis not present

## 2018-05-08 DIAGNOSIS — M25831 Other specified joint disorders, right wrist: Secondary | ICD-10-CM | POA: Diagnosis not present

## 2018-05-08 DIAGNOSIS — M1811 Unilateral primary osteoarthritis of first carpometacarpal joint, right hand: Secondary | ICD-10-CM | POA: Diagnosis not present

## 2018-05-08 DIAGNOSIS — T84210A Breakdown (mechanical) of internal fixation device of bones of hand and fingers, initial encounter: Secondary | ICD-10-CM | POA: Diagnosis not present

## 2018-05-08 DIAGNOSIS — G8918 Other acute postprocedural pain: Secondary | ICD-10-CM | POA: Diagnosis not present

## 2018-05-11 DIAGNOSIS — Z79899 Other long term (current) drug therapy: Secondary | ICD-10-CM | POA: Diagnosis not present

## 2018-05-17 DIAGNOSIS — I7300 Raynaud's syndrome without gangrene: Secondary | ICD-10-CM | POA: Diagnosis not present

## 2018-05-17 DIAGNOSIS — M3210 Systemic lupus erythematosus, organ or system involvement unspecified: Secondary | ICD-10-CM | POA: Diagnosis not present

## 2018-05-17 DIAGNOSIS — Z79899 Other long term (current) drug therapy: Secondary | ICD-10-CM | POA: Diagnosis not present

## 2018-05-23 DIAGNOSIS — M13841 Other specified arthritis, right hand: Secondary | ICD-10-CM | POA: Diagnosis not present

## 2018-06-06 DIAGNOSIS — M13841 Other specified arthritis, right hand: Secondary | ICD-10-CM | POA: Diagnosis not present

## 2018-06-20 DIAGNOSIS — M1811 Unilateral primary osteoarthritis of first carpometacarpal joint, right hand: Secondary | ICD-10-CM | POA: Diagnosis not present

## 2018-06-20 DIAGNOSIS — M25641 Stiffness of right hand, not elsewhere classified: Secondary | ICD-10-CM | POA: Diagnosis not present

## 2018-06-26 DIAGNOSIS — K219 Gastro-esophageal reflux disease without esophagitis: Secondary | ICD-10-CM | POA: Diagnosis not present

## 2018-06-26 DIAGNOSIS — F419 Anxiety disorder, unspecified: Secondary | ICD-10-CM | POA: Diagnosis not present

## 2018-06-26 DIAGNOSIS — N951 Menopausal and female climacteric states: Secondary | ICD-10-CM | POA: Diagnosis not present

## 2018-06-26 DIAGNOSIS — M797 Fibromyalgia: Secondary | ICD-10-CM | POA: Diagnosis not present

## 2018-06-27 DIAGNOSIS — M25641 Stiffness of right hand, not elsewhere classified: Secondary | ICD-10-CM | POA: Diagnosis not present

## 2018-07-04 DIAGNOSIS — M25641 Stiffness of right hand, not elsewhere classified: Secondary | ICD-10-CM | POA: Diagnosis not present

## 2018-07-11 DIAGNOSIS — M25641 Stiffness of right hand, not elsewhere classified: Secondary | ICD-10-CM | POA: Diagnosis not present

## 2018-07-16 DIAGNOSIS — M25641 Stiffness of right hand, not elsewhere classified: Secondary | ICD-10-CM | POA: Diagnosis not present

## 2018-07-18 DIAGNOSIS — M25641 Stiffness of right hand, not elsewhere classified: Secondary | ICD-10-CM | POA: Diagnosis not present

## 2018-07-23 DIAGNOSIS — M25641 Stiffness of right hand, not elsewhere classified: Secondary | ICD-10-CM | POA: Diagnosis not present

## 2018-07-23 DIAGNOSIS — M1811 Unilateral primary osteoarthritis of first carpometacarpal joint, right hand: Secondary | ICD-10-CM | POA: Diagnosis not present

## 2018-07-25 ENCOUNTER — Other Ambulatory Visit: Payer: Self-pay | Admitting: Family

## 2018-07-25 DIAGNOSIS — Z1231 Encounter for screening mammogram for malignant neoplasm of breast: Secondary | ICD-10-CM

## 2018-07-26 DIAGNOSIS — M25641 Stiffness of right hand, not elsewhere classified: Secondary | ICD-10-CM | POA: Diagnosis not present

## 2018-08-02 DIAGNOSIS — M79644 Pain in right finger(s): Secondary | ICD-10-CM | POA: Diagnosis not present

## 2018-08-06 DIAGNOSIS — M79644 Pain in right finger(s): Secondary | ICD-10-CM | POA: Diagnosis not present

## 2018-08-08 NOTE — Pre-Procedure Instructions (Signed)
KYNLEIGH ARTZ  08/08/2018      [facility] [address], [place] [address] Phone: [phone] Fax: [phone]    Your procedure is scheduled on Friday November 29th.  Report to [facility] Admitting at [place].  Call this number if you have problems the morning of surgery:  [phone]   Remember:  Do not eat or drink after midnight.    Take these medicines the morning of surgery with A SIP OF WATER   None  7 days prior to surgery STOP taking any Aspirin(unless otherwise instructed by your surgeon), Aleve, Naproxen, Ibuprofen, Motrin, Advil, Goody's, BC's, all herbal medications, fish oil, and all vitamins     Do not wear jewelry, make-up or nail polish.  Do not wear lotions, powders, or perfumes, or deodorant.  Do not shave 48 hours prior to surgery.  Men may shave face and neck.  Do not bring valuables to the hospital.  [facility] is not responsible for any belongings or valuables.  Contacts, dentures or bridgework may not be worn into surgery.  Leave your suitcase in the car.  After surgery it may be brought to your room.  For patients admitted to the hospital, discharge time will be determined by your treatment team.  Patients discharged the day of surgery will not be allowed to drive home.    [facility]- Preparing For Surgery  Before surgery, you can play an important role. Because skin is not sterile, your skin needs to be as free of germs as possible. You can reduce the number of germs on your skin by washing with CHG (chlorahexidine gluconate) Soap before surgery.  CHG is an antiseptic cleaner which kills germs and bonds with the skin to continue killing germs even after washing.    Oral Hygiene is also important to reduce your risk of infection.  Remember - BRUSH YOUR TEETH THE MORNING OF SURGERY WITH YOUR REGULAR TOOTHPASTE  Please do not use if you have an allergy to CHG or  antibacterial soaps. If your skin becomes reddened/irritated stop using the CHG.  Do not shave (including legs and underarms) for at least 48 hours prior to first CHG shower. It is OK to shave your face.  Please follow these instructions carefully.   1. Shower the NIGHT BEFORE SURGERY and the MORNING OF SURGERY with CHG.   2. If you chose to wash your hair, wash your hair first as usual with your normal shampoo.  3. After you shampoo, rinse your hair and body thoroughly to remove the shampoo.  4. Use CHG as you would any other liquid soap. You can apply CHG directly to the skin and wash gently with a scrungie or a clean washcloth.   5. Apply the CHG Soap to your body ONLY FROM THE NECK DOWN.  Do not use on open wounds or open sores. Avoid contact with your eyes, ears, mouth and genitals (private parts). Wash Face and genitals (private parts)  with your normal soap.  6. Wash thoroughly, paying special attention to the area where your surgery will be performed.  7. Thoroughly rinse your body with warm water from the neck down.  8. DO NOT shower/wash with your normal soap after using and rinsing off the CHG Soap.  9. Pat yourself dry with a CLEAN TOWEL.  10. Wear CLEAN PAJAMAS to bed the night before surgery, wear comfortable clothes the morning of surgery  11. Place CLEAN SHEETS on your bed the night of your first shower and DO NOT SLEEP WITH PETS.    Day of Surgery:  Do not apply any deodorants/lotions.  Please wear clean clothes to the hospital/surgery center.   Remember to brush your teeth WITH YOUR REGULAR TOOTHPASTE.    Please read over the following fact sheets that you were given.

## 2018-08-09 ENCOUNTER — Other Ambulatory Visit: Payer: Self-pay

## 2018-08-09 ENCOUNTER — Encounter (HOSPITAL_COMMUNITY): Payer: Self-pay

## 2018-08-09 ENCOUNTER — Encounter (HOSPITAL_COMMUNITY)
Admission: RE | Admit: 2018-08-09 | Discharge: 2018-08-09 | Disposition: A | Discharge status: Home / Self Care | Payer: BLUE CROSS/BLUE SHIELD | Source: Ambulatory Visit | Attending: Orthopedic Surgery | Admitting: Orthopedic Surgery

## 2018-08-09 DIAGNOSIS — I10 Essential (primary) hypertension: Secondary | ICD-10-CM | POA: Diagnosis not present

## 2018-08-09 DIAGNOSIS — Z01818 Encounter for other preprocedural examination: Secondary | ICD-10-CM | POA: Diagnosis not present

## 2018-08-09 HISTORY — DX: Unspecified osteoarthritis, unspecified site: M19.90

## 2018-08-09 HISTORY — DX: Systemic lupus erythematosus, unspecified: M32.9

## 2018-08-09 HISTORY — DX: Anxiety disorder, unspecified: F41.9

## 2018-08-09 HISTORY — DX: Personal history of other diseases of the digestive system: Z87.19

## 2018-08-09 HISTORY — DX: Leiomyoma of uterus, unspecified: D25.9

## 2018-08-09 HISTORY — DX: Fibromyalgia: M79.7

## 2018-08-09 HISTORY — DX: Essential (primary) hypertension: I10

## 2018-08-09 HISTORY — DX: Reserved for concepts with insufficient information to code with codable children: IMO0002

## 2018-08-09 HISTORY — DX: Myalgia, other site: M79.18

## 2018-08-09 HISTORY — DX: Gastro-esophageal reflux disease without esophagitis: K21.9

## 2018-08-09 LAB — COMPREHENSIVE METABOLIC PANEL
ALT: 17 U/L (ref 0–44)
AST: 26 U/L (ref 15–41)
Albumin: 4.3 g/dL (ref 3.5–5.0)
Alkaline Phosphatase: 72 U/L (ref 38–126)
Anion gap: 8 (ref 5–15)
BUN: 15 mg/dL (ref 6–20)
CHLORIDE: 105 mmol/L (ref 98–111)
CO2: 24 mmol/L (ref 22–32)
Calcium: 9.3 mg/dL (ref 8.9–10.3)
Creatinine, Ser: 0.81 mg/dL (ref 0.44–1.00)
Glucose, Bld: 95 mg/dL (ref 70–99)
POTASSIUM: 4.5 mmol/L (ref 3.5–5.1)
SODIUM: 137 mmol/L (ref 135–145)
Total Bilirubin: 0.6 mg/dL (ref 0.3–1.2)
Total Protein: 6.8 g/dL (ref 6.5–8.1)

## 2018-08-09 LAB — CBC
HCT: 41.4 % (ref 36.0–46.0)
Hemoglobin: 13.4 g/dL (ref 12.0–15.0)
MCH: 31 pg (ref 26.0–34.0)
MCHC: 32.4 g/dL (ref 30.0–36.0)
MCV: 95.8 fL (ref 80.0–100.0)
NRBC: 0 % (ref 0.0–0.2)
PLATELETS: 348 10*3/uL (ref 150–400)
RBC: 4.32 MIL/uL (ref 3.87–5.11)
RDW: 13 % (ref 11.5–15.5)
WBC: 7.1 10*3/uL (ref 4.0–10.5)

## 2018-08-09 MED ORDER — BUPIVACAINE HCL (PF) 0.25 % IJ SOLN
INTRAMUSCULAR | Status: AC
  Filled 2018-08-09: qty 30

## 2018-08-09 NOTE — Progress Notes (Signed)
PCP - Ernestene Kiel MD  Chest x-ray -  N/A EKG - 08/09/18  Sleep Study - 2017 was negative  Blood Thinner Instructions: N/A Aspirin Instructions: N/A  Anesthesia review: none  Patient denies shortness of breath, fever, cough and chest pain at PAT appointment   Patient verbalized understanding of instructions that were given to them at the PAT appointment. Patient was also instructed that they will need to review over the PAT instructions again at home before surgery.

## 2018-08-16 NOTE — Anesthesia Preprocedure Evaluation (Addendum)
Anesthesia Evaluation  Patient identified by MRN, date of birth, ID band Patient awake    Reviewed: Allergy & Precautions, NPO status , Patient's Chart, lab work & pertinent test results  History of Anesthesia Complications Negative for: history of anesthetic complications  Airway Mallampati: III  TM Distance: >3 FB Neck ROM: Full    Dental  (+) Dental Advisory Given, Teeth Intact   Pulmonary former smoker,    breath sounds clear to auscultation       Cardiovascular Exercise Tolerance: Good hypertension (no meds), (-) angina Rhythm:Regular Rate:Normal     Neuro/Psych PSYCHIATRIC DISORDERS Anxiety negative neurological ROS     GI/Hepatic Neg liver ROS, hiatal hernia, GERD  Medicated and Controlled,  Endo/Other  negative endocrine ROS  Renal/GU negative Renal ROS     Musculoskeletal  (+) Arthritis , Fibromyalgia -  Abdominal   Peds  Hematology negative hematology ROS (+)   Anesthesia Other Findings Lupus  Reproductive/Obstetrics                            Anesthesia Physical Anesthesia Plan  ASA: II  Anesthesia Plan: General   Post-op Pain Management:  Regional for Post-op pain   Induction: Intravenous  PONV Risk Score and Plan: 3 and Treatment may vary due to age or medical condition, Ondansetron, Dexamethasone and Midazolam  Airway Management Planned: LMA  Additional Equipment: None  Intra-op Plan:   Post-operative Plan: Extubation in OR  Informed Consent: I have reviewed the patients History and Physical, chart, labs and discussed the procedure including the risks, benefits and alternatives for the proposed anesthesia with the patient or authorized representative who has indicated his/her understanding and acceptance.   Dental advisory given  Plan Discussed with: CRNA and Anesthesiologist  Anesthesia Plan Comments:        Anesthesia Quick Evaluation

## 2018-08-17 ENCOUNTER — Encounter (HOSPITAL_COMMUNITY)
Admission: RE | Disposition: A | Discharge status: Home / Self Care | Payer: Self-pay | Source: Ambulatory Visit | Attending: Orthopedic Surgery

## 2018-08-17 ENCOUNTER — Encounter (HOSPITAL_COMMUNITY): Payer: Self-pay | Admitting: Urology

## 2018-08-17 ENCOUNTER — Ambulatory Visit (HOSPITAL_COMMUNITY): Payer: BLUE CROSS/BLUE SHIELD | Admitting: Certified Registered Nurse Anesthetist

## 2018-08-17 ENCOUNTER — Other Ambulatory Visit: Payer: Self-pay

## 2018-08-17 ENCOUNTER — Observation Stay (HOSPITAL_COMMUNITY)
Admission: RE | Admit: 2018-08-17 | Discharge: 2018-08-18 | Disposition: A | Discharge status: Home / Self Care | Payer: BLUE CROSS/BLUE SHIELD | Source: Ambulatory Visit | Attending: Orthopedic Surgery | Admitting: Orthopedic Surgery

## 2018-08-17 DIAGNOSIS — F419 Anxiety disorder, unspecified: Secondary | ICD-10-CM | POA: Diagnosis not present

## 2018-08-17 DIAGNOSIS — K219 Gastro-esophageal reflux disease without esophagitis: Secondary | ICD-10-CM | POA: Insufficient documentation

## 2018-08-17 DIAGNOSIS — M180 Bilateral primary osteoarthritis of first carpometacarpal joints: Secondary | ICD-10-CM | POA: Diagnosis not present

## 2018-08-17 DIAGNOSIS — Z881 Allergy status to other antibiotic agents status: Secondary | ICD-10-CM | POA: Insufficient documentation

## 2018-08-17 DIAGNOSIS — Y831 Surgical operation with implant of artificial internal device as the cause of abnormal reaction of the patient, or of later complication, without mention of misadventure at the time of the procedure: Secondary | ICD-10-CM | POA: Diagnosis not present

## 2018-08-17 DIAGNOSIS — M1812 Unilateral primary osteoarthritis of first carpometacarpal joint, left hand: Secondary | ICD-10-CM | POA: Diagnosis present

## 2018-08-17 DIAGNOSIS — Z87891 Personal history of nicotine dependence: Secondary | ICD-10-CM | POA: Insufficient documentation

## 2018-08-17 DIAGNOSIS — G8918 Other acute postprocedural pain: Secondary | ICD-10-CM | POA: Diagnosis not present

## 2018-08-17 DIAGNOSIS — I10 Essential (primary) hypertension: Secondary | ICD-10-CM | POA: Diagnosis not present

## 2018-08-17 DIAGNOSIS — T8484XA Pain due to internal orthopedic prosthetic devices, implants and grafts, initial encounter: Secondary | ICD-10-CM | POA: Diagnosis not present

## 2018-08-17 DIAGNOSIS — M19032 Primary osteoarthritis, left wrist: Secondary | ICD-10-CM | POA: Diagnosis not present

## 2018-08-17 DIAGNOSIS — Z79899 Other long term (current) drug therapy: Secondary | ICD-10-CM | POA: Diagnosis not present

## 2018-08-17 HISTORY — DX: Interstitial cystitis (chronic) without hematuria: N30.10

## 2018-08-17 HISTORY — PX: FINGER ARTHROSCOPY WITH CARPOMETACARPEL (CMC) ARTHROPLASTY: SHX5629

## 2018-08-17 SURGERY — FINGER ARTHROSCOPY WITH CARPOMETACARPEL (CMC) ARTHROPLASTY
Anesthesia: General | Site: Arm Lower | Laterality: Left

## 2018-08-17 MED ORDER — FAMOTIDINE 20 MG PO TABS: 20.0000 mg | ORAL_TABLET | Freq: Two times a day (BID) | ORAL | Status: DC | PRN

## 2018-08-17 MED ORDER — 0.9 % SODIUM CHLORIDE (POUR BTL) OPTIME
TOPICAL | Status: DC | PRN
  Administered 2018-08-17: 1000 mL

## 2018-08-17 MED ORDER — BUPIVACAINE HCL (PF) 0.25 % IJ SOLN: INTRAMUSCULAR | Status: DC | PRN

## 2018-08-17 MED ORDER — MONTELUKAST SODIUM 10 MG PO TABS
10.0000 mg | ORAL_TABLET | Freq: Every day | ORAL | Status: DC
  Administered 2018-08-17: 10 mg via ORAL
  Filled 2018-08-17: qty 1

## 2018-08-17 MED ORDER — ONDANSETRON HCL 4 MG PO TABS
4.0000 mg | ORAL_TABLET | Freq: Four times a day (QID) | ORAL | Status: DC | PRN
  Administered 2018-08-18: 4 mg via ORAL
  Filled 2018-08-17: qty 1

## 2018-08-17 MED ORDER — FENTANYL CITRATE (PF) 100 MCG/2ML IJ SOLN: 25.0000 ug | INTRAMUSCULAR | Status: DC | PRN

## 2018-08-17 MED ORDER — METHOCARBAMOL 1000 MG/10ML IJ SOLN
500.0000 mg | Freq: Four times a day (QID) | INTRAVENOUS | Status: DC | PRN
  Filled 2018-08-17: qty 5

## 2018-08-17 MED ORDER — BUPIVACAINE HCL (PF) 0.25 % IJ SOLN
INTRAMUSCULAR | Status: AC
  Filled 2018-08-17: qty 30

## 2018-08-17 MED ORDER — LIDOCAINE 2% (20 MG/ML) 5 ML SYRINGE
INTRAMUSCULAR | Status: AC
  Filled 2018-08-17: qty 5

## 2018-08-17 MED ORDER — ONDANSETRON HCL 4 MG/2ML IJ SOLN: INTRAMUSCULAR | Status: DC | PRN

## 2018-08-17 MED ORDER — LIDOCAINE HCL (CARDIAC) PF 100 MG/5ML IV SOSY
PREFILLED_SYRINGE | INTRAVENOUS | Status: DC | PRN
  Administered 2018-08-17: 60 mg via INTRATRACHEAL

## 2018-08-17 MED ORDER — ROPIVACAINE HCL 5 MG/ML IJ SOLN
INTRAMUSCULAR | Status: DC | PRN
  Administered 2018-08-17: 30 mL via PERINEURAL

## 2018-08-17 MED ORDER — PHENYLEPHRINE 40 MCG/ML (10ML) SYRINGE FOR IV PUSH (FOR BLOOD PRESSURE SUPPORT)
PREFILLED_SYRINGE | INTRAVENOUS | Status: AC
  Filled 2018-08-17: qty 20

## 2018-08-17 MED ORDER — DIPHENHYDRAMINE HCL 50 MG/ML IJ SOLN
INTRAMUSCULAR | Status: DC | PRN
  Administered 2018-08-17: 6.25 mg via INTRAVENOUS

## 2018-08-17 MED ORDER — LORATADINE 10 MG PO TABS
10.0000 mg | ORAL_TABLET | Freq: Every day | ORAL | Status: DC
  Administered 2018-08-18: 10 mg via ORAL
  Filled 2018-08-17: qty 1

## 2018-08-17 MED ORDER — PROPOFOL 10 MG/ML IV BOLUS
INTRAVENOUS | Status: AC
  Filled 2018-08-17: qty 80

## 2018-08-17 MED ORDER — GLYCOPYRROLATE PF 0.2 MG/ML IJ SOSY
PREFILLED_SYRINGE | INTRAMUSCULAR | Status: AC
  Filled 2018-08-17: qty 1

## 2018-08-17 MED ORDER — ALPRAZOLAM 0.5 MG PO TABS
0.5000 mg | ORAL_TABLET | Freq: Four times a day (QID) | ORAL | Status: DC | PRN
  Administered 2018-08-17 – 2018-08-18 (×3): 0.5 mg via ORAL
  Filled 2018-08-17 (×3): qty 1

## 2018-08-17 MED ORDER — FENTANYL CITRATE (PF) 250 MCG/5ML IJ SOLN
INTRAMUSCULAR | Status: AC
  Filled 2018-08-17: qty 5

## 2018-08-17 MED ORDER — FLUTICASONE PROPIONATE 50 MCG/ACT NA SUSP
1.0000 | Freq: Every day | NASAL | Status: DC
  Filled 2018-08-17: qty 16

## 2018-08-17 MED ORDER — OXYCODONE HCL 5 MG PO TABS
5.0000 mg | ORAL_TABLET | ORAL | Status: DC | PRN
  Administered 2018-08-17 – 2018-08-18 (×6): 10 mg via ORAL
  Filled 2018-08-17 (×6): qty 2

## 2018-08-17 MED ORDER — LACTATED RINGERS IV SOLN
INTRAVENOUS | Status: DC | PRN
  Administered 2018-08-17 (×2): via INTRAVENOUS

## 2018-08-17 MED ORDER — HYDROMORPHONE HCL 1 MG/ML IJ SOLN
0.5000 mg | INTRAMUSCULAR | Status: DC | PRN
  Administered 2018-08-17 – 2018-08-18 (×3): 1 mg via INTRAVENOUS
  Filled 2018-08-17 (×3): qty 1

## 2018-08-17 MED ORDER — PROPOFOL 10 MG/ML IV BOLUS
INTRAVENOUS | Status: DC | PRN
  Administered 2018-08-17: 200 mg via INTRAVENOUS

## 2018-08-17 MED ORDER — METHOCARBAMOL 500 MG PO TABS
500.0000 mg | ORAL_TABLET | Freq: Four times a day (QID) | ORAL | Status: DC | PRN
  Administered 2018-08-17 – 2018-08-18 (×4): 500 mg via ORAL
  Filled 2018-08-17 (×5): qty 1

## 2018-08-17 MED ORDER — CHLORHEXIDINE GLUCONATE 4 % EX LIQD: 60.0000 mL | Freq: Once | CUTANEOUS | Status: DC

## 2018-08-17 MED ORDER — MIDAZOLAM HCL 2 MG/2ML IJ SOLN
INTRAMUSCULAR | Status: AC
  Administered 2018-08-17: 07:00:00
  Filled 2018-08-17: qty 2

## 2018-08-17 MED ORDER — CEFAZOLIN SODIUM-DEXTROSE 2-4 GM/100ML-% IV SOLN
INTRAVENOUS | Status: AC
  Filled 2018-08-17: qty 100

## 2018-08-17 MED ORDER — EPHEDRINE 5 MG/ML INJ
INTRAVENOUS | Status: AC
  Filled 2018-08-17: qty 10

## 2018-08-17 MED ORDER — VITAMIN C 500 MG PO TABS
1000.0000 mg | ORAL_TABLET | Freq: Every day | ORAL | Status: DC
  Administered 2018-08-18: 1000 mg via ORAL
  Filled 2018-08-17 (×2): qty 2

## 2018-08-17 MED ORDER — FENTANYL CITRATE (PF) 250 MCG/5ML IJ SOLN
INTRAMUSCULAR | Status: DC | PRN
  Administered 2018-08-17 (×2): 50 ug via INTRAVENOUS
  Administered 2018-08-17: 25 ug via INTRAVENOUS

## 2018-08-17 MED ORDER — EPHEDRINE SULFATE 50 MG/ML IJ SOLN
INTRAMUSCULAR | Status: DC | PRN
  Administered 2018-08-17: 10 mg via INTRAVENOUS
  Administered 2018-08-17: 5 mg via INTRAVENOUS

## 2018-08-17 MED ORDER — METOCLOPRAMIDE HCL 5 MG/ML IJ SOLN
INTRAMUSCULAR | Status: DC | PRN
  Administered 2018-08-17: 5 mg via INTRAVENOUS

## 2018-08-17 MED ORDER — PROMETHAZINE HCL 25 MG/ML IJ SOLN: 6.2500 mg | INTRAMUSCULAR | Status: DC | PRN

## 2018-08-17 MED ORDER — MIDAZOLAM HCL 2 MG/2ML IJ SOLN
INTRAMUSCULAR | Status: DC | PRN
  Administered 2018-08-17 (×2): 2 mg via INTRAVENOUS

## 2018-08-17 MED ORDER — PROMETHAZINE HCL 12.5 MG RE SUPP
12.5000 mg | Freq: Four times a day (QID) | RECTAL | Status: DC | PRN
  Filled 2018-08-17 (×3): qty 1

## 2018-08-17 MED ORDER — LEVOCETIRIZINE DIHYDROCHLORIDE 5 MG PO TABS: 5.0000 mg | ORAL_TABLET | Freq: Every day | ORAL | Status: DC

## 2018-08-17 MED ORDER — DEXAMETHASONE SODIUM PHOSPHATE 10 MG/ML IJ SOLN
INTRAMUSCULAR | Status: DC | PRN
  Administered 2018-08-17: 10 mg via INTRAVENOUS

## 2018-08-17 MED ORDER — CEFAZOLIN SODIUM-DEXTROSE 2-4 GM/100ML-% IV SOLN
2.0000 g | INTRAVENOUS | Status: AC
  Administered 2018-08-17: 2 g via INTRAVENOUS

## 2018-08-17 MED ORDER — OXYCODONE HCL 5 MG/5ML PO SOLN: 5.0000 mg | Freq: Once | ORAL | Status: DC | PRN

## 2018-08-17 MED ORDER — DIPHENHYDRAMINE HCL 50 MG/ML IJ SOLN
INTRAMUSCULAR | Status: AC
  Filled 2018-08-17: qty 1

## 2018-08-17 MED ORDER — MIDAZOLAM HCL 2 MG/2ML IJ SOLN
INTRAMUSCULAR | Status: AC
  Filled 2018-08-17: qty 2

## 2018-08-17 MED ORDER — LACTATED RINGERS IV SOLN
INTRAVENOUS | Status: DC
  Administered 2018-08-17: 11:00:00 via INTRAVENOUS

## 2018-08-17 MED ORDER — SCOPOLAMINE 1 MG/3DAYS TD PT72
MEDICATED_PATCH | TRANSDERMAL | Status: DC | PRN
  Administered 2018-08-17: 1 via TRANSDERMAL

## 2018-08-17 MED ORDER — DOCUSATE SODIUM 100 MG PO CAPS
100.0000 mg | ORAL_CAPSULE | Freq: Two times a day (BID) | ORAL | Status: DC
  Filled 2018-08-17 (×3): qty 1

## 2018-08-17 MED ORDER — ONDANSETRON HCL 4 MG/2ML IJ SOLN
4.0000 mg | Freq: Four times a day (QID) | INTRAMUSCULAR | Status: DC | PRN
  Administered 2018-08-18: 4 mg via INTRAVENOUS
  Filled 2018-08-17: qty 2

## 2018-08-17 MED ORDER — ONDANSETRON HCL 4 MG/2ML IJ SOLN
INTRAMUSCULAR | Status: AC
  Filled 2018-08-17: qty 2

## 2018-08-17 MED ORDER — PHENYLEPHRINE HCL 10 MG/ML IJ SOLN
INTRAMUSCULAR | Status: DC | PRN
  Administered 2018-08-17: 120 ug via INTRAVENOUS
  Administered 2018-08-17 (×2): 80 ug via INTRAVENOUS
  Administered 2018-08-17: 120 ug via INTRAVENOUS
  Administered 2018-08-17: 80 ug via INTRAVENOUS
  Administered 2018-08-17: 40 ug via INTRAVENOUS
  Administered 2018-08-17 (×2): 80 ug via INTRAVENOUS
  Administered 2018-08-17: 40 ug via INTRAVENOUS
  Administered 2018-08-17: 80 ug via INTRAVENOUS

## 2018-08-17 MED ORDER — METOCLOPRAMIDE HCL 5 MG/ML IJ SOLN
INTRAMUSCULAR | Status: AC
  Filled 2018-08-17: qty 2

## 2018-08-17 MED ORDER — OXYCODONE HCL 5 MG PO TABS: 5.0000 mg | ORAL_TABLET | Freq: Once | ORAL | Status: DC | PRN

## 2018-08-17 MED ORDER — DEXAMETHASONE SODIUM PHOSPHATE 10 MG/ML IJ SOLN
INTRAMUSCULAR | Status: AC
  Filled 2018-08-17: qty 1

## 2018-08-17 MED ORDER — SCOPOLAMINE 1 MG/3DAYS TD PT72
MEDICATED_PATCH | TRANSDERMAL | Status: AC
  Filled 2018-08-17: qty 1

## 2018-08-17 MED ORDER — CEFAZOLIN SODIUM-DEXTROSE 1-4 GM/50ML-% IV SOLN
1.0000 g | INTRAVENOUS | Status: AC
  Filled 2018-08-17: qty 50

## 2018-08-17 MED ORDER — CEFAZOLIN SODIUM-DEXTROSE 1-4 GM/50ML-% IV SOLN
1.0000 g | Freq: Three times a day (TID) | INTRAVENOUS | Status: DC
  Administered 2018-08-17 – 2018-08-18 (×2): 1 g via INTRAVENOUS
  Filled 2018-08-17 (×3): qty 50

## 2018-08-17 MED ORDER — ONDANSETRON HCL 4 MG/2ML IJ SOLN
INTRAMUSCULAR | Status: DC | PRN
  Administered 2018-08-17: 4 mg via INTRAVENOUS

## 2018-08-17 SURGICAL SUPPLY — 53 items
BAG DECANTER FOR FLEXI CONT (MISCELLANEOUS) ×3 IMPLANT
BANDAGE ACE 3X5.8 VEL STRL LF (GAUZE/BANDAGES/DRESSINGS) ×3 IMPLANT
BANDAGE ACE 4X5 VEL STRL LF (GAUZE/BANDAGES/DRESSINGS) IMPLANT
BNDG ELASTIC 2X5.8 VLCR STR LF (GAUZE/BANDAGES/DRESSINGS) ×3 IMPLANT
BNDG GAUZE ELAST 4 BULKY (GAUZE/BANDAGES/DRESSINGS) ×3 IMPLANT
CLOSURE STERI-STRIP 1/2X4 (GAUZE/BANDAGES/DRESSINGS) ×1
CLSR STERI-STRIP ANTIMIC 1/2X4 (GAUZE/BANDAGES/DRESSINGS) ×2 IMPLANT
CONT SPEC 4OZ CLIKSEAL STRL BL (MISCELLANEOUS) ×3 IMPLANT
CORDS BIPOLAR (ELECTRODE) ×3 IMPLANT
COVER SURGICAL LIGHT HANDLE (MISCELLANEOUS) ×3 IMPLANT
CUFF TOURNIQUET SINGLE 18IN (TOURNIQUET CUFF) ×3 IMPLANT
DRAPE OEC MINIVIEW 54X84 (DRAPES) ×3 IMPLANT
DRAPE SURG 17X23 STRL (DRAPES) ×3 IMPLANT
DRSG ADAPTIC 3X8 NADH LF (GAUZE/BANDAGES/DRESSINGS) ×3 IMPLANT
FIBERLOOP 2 0 (SUTURE) ×6 IMPLANT
GAUZE SPONGE 4X4 12PLY STRL (GAUZE/BANDAGES/DRESSINGS) ×3 IMPLANT
GAUZE XEROFORM 1X8 LF (GAUZE/BANDAGES/DRESSINGS) ×3 IMPLANT
GAUZE XEROFORM 5X9 LF (GAUZE/BANDAGES/DRESSINGS) ×3 IMPLANT
GLOVE BIOGEL M 8.0 STRL (GLOVE) ×3 IMPLANT
GLOVE SS BIOGEL STRL SZ 8 (GLOVE) ×1 IMPLANT
GLOVE SUPERSENSE BIOGEL SZ 8 (GLOVE) ×2
GOWN STRL REUS W/ TWL LRG LVL3 (GOWN DISPOSABLE) ×2 IMPLANT
GOWN STRL REUS W/ TWL XL LVL3 (GOWN DISPOSABLE) ×1 IMPLANT
GOWN STRL REUS W/TWL LRG LVL3 (GOWN DISPOSABLE) ×4
GOWN STRL REUS W/TWL XL LVL3 (GOWN DISPOSABLE) ×2
IMPL SWIVELOCK 3.5X13.5 (Anchor) ×1 IMPLANT
IMPLANT SWIVELOCK 3.5X13.5 (Anchor) ×3 IMPLANT
KIT ASCP FXDISP 3X8XBTNDS (KITS) ×1 IMPLANT
KIT BASIN OR (CUSTOM PROCEDURE TRAY) ×3 IMPLANT
KIT BIO-TENODESIS 3X8 DISP (KITS) ×2
KIT TURNOVER KIT B (KITS) ×3 IMPLANT
MANIFOLD NEPTUNE II (INSTRUMENTS) ×3 IMPLANT
NEEDLE HYPO 25GX1X1/2 BEV (NEEDLE) ×3 IMPLANT
NS IRRIG 1000ML POUR BTL (IV SOLUTION) ×3 IMPLANT
PACK ORTHO EXTREMITY (CUSTOM PROCEDURE TRAY) ×3 IMPLANT
PAD ARMBOARD 7.5X6 YLW CONV (MISCELLANEOUS) ×6 IMPLANT
PAD CAST 4YDX4 CTTN HI CHSV (CAST SUPPLIES) ×1 IMPLANT
PADDING CAST COTTON 4X4 STRL (CAST SUPPLIES) ×2
SCRUB BETADINE 4OZ XXX (MISCELLANEOUS) ×3 IMPLANT
SOL PREP POV-IOD 4OZ 10% (MISCELLANEOUS) ×6 IMPLANT
SPECIMEN JAR SMALL (MISCELLANEOUS) ×3 IMPLANT
SUCTION FRAZIER HANDLE 10FR (MISCELLANEOUS)
SUCTION TUBE FRAZIER 10FR DISP (MISCELLANEOUS) IMPLANT
SUT FIBERWIRE 3-0 18 TAPR NDL (SUTURE) ×6
SUT PROLENE 4 0 PS 2 18 (SUTURE) ×9 IMPLANT
SUT VIC AB 3-0 FS2 27 (SUTURE) ×3 IMPLANT
SUTURE FIBERWR 3-0 18 TAPR NDL (SUTURE) ×2 IMPLANT
SYR CONTROL 10ML LL (SYRINGE) ×3 IMPLANT
TOWEL OR 17X24 6PK STRL BLUE (TOWEL DISPOSABLE) ×3 IMPLANT
TOWEL OR 17X26 10 PK STRL BLUE (TOWEL DISPOSABLE) ×3 IMPLANT
TUBE CONNECTING 12'X1/4 (SUCTIONS) ×1
TUBE CONNECTING 12X1/4 (SUCTIONS) ×2 IMPLANT
UNDERPAD 30X30 (UNDERPADS AND DIAPERS) ×3 IMPLANT

## 2018-08-17 NOTE — Anesthesia Procedure Notes (Addendum)
Anesthesia Regional Block: Axillary brachial plexus block   Pre-Anesthetic Checklist: ,, timeout performed, Correct Patient, Correct Site, Correct Laterality, Correct Procedure, Correct Position, site marked, Risks and benefits discussed,  Surgical consent,  Pre-op evaluation,  At surgeon's request and post-op pain management  Laterality: Left  Prep: chloraprep       Needles:  Injection technique: Single-shot  Needle Type: Echogenic Needle     Needle Length: 9cm  Needle Gauge: 21     Additional Needles:   Narrative:  Start time: 08/17/2018 6:53 AM End time: 08/17/2018 6:57 AM Injection made incrementally with aspirations every 5 mL.  Performed by: Personally  Anesthesiologist: Audry Pili, MD  Additional Notes: No pain on injection. No increased resistance to injection. Injection made in 5cc increments. Good needle visualization. Patient tolerated the procedure well.

## 2018-08-17 NOTE — Anesthesia Postprocedure Evaluation (Signed)
Anesthesia Post Note  Patient: Tara Graves  Procedure(s) Performed: Left thumb carpometacarpal arthroplasty with double tendon transfer and left forearm hardware removal (Left Arm Lower)     Patient location during evaluation: PACU Anesthesia Type: General Level of consciousness: awake and alert Pain management: pain level controlled Vital Signs Assessment: post-procedure vital signs reviewed and stable Respiratory status: spontaneous breathing, nonlabored ventilation and respiratory function stable Cardiovascular status: blood pressure returned to baseline and stable Postop Assessment: no apparent nausea or vomiting Anesthetic complications: no    Last Vitals:  Vitals:   08/17/18 0724 08/17/18 1004  BP:  (!) 145/107  Pulse: 69 95  Resp: 17 11  Temp:  36.5 C  SpO2: 96% 100%    Last Pain:  Vitals:   08/17/18 1004  PainSc: 0-No pain                 Audry Pili

## 2018-08-17 NOTE — Transfer of Care (Signed)
Immediate Anesthesia Transfer of Care Note  Patient: Tara Graves  Procedure(s) Performed: Left thumb carpometacarpal arthroplasty with double tendon transfer and left forearm hardware removal (Left Arm Lower)  Patient Location: PACU  Anesthesia Type:General  Level of Consciousness: alert  and patient cooperative  Airway & Oxygen Therapy: Patient Spontanous Breathing and Patient connected to face mask oxygen  Post-op Assessment: Report given to RN and Post -op Vital signs reviewed and stable  Post vital signs: Reviewed and stable  Last Vitals:  Vitals Value Taken Time  BP 145/107 08/17/2018 10:04 AM  Temp    Pulse 95 08/17/2018 10:04 AM  Resp 11 08/17/2018 10:04 AM  SpO2 100 % 08/17/2018 10:04 AM  Vitals shown include unvalidated device data.  Last Pain:  Vitals:   08/17/18 0555  PainSc: 1       Patients Stated Pain Goal: 3 (47/09/62 8366)  Complications: No apparent anesthesia complications

## 2018-08-17 NOTE — Anesthesia Procedure Notes (Signed)
Procedure Name: LMA Insertion Date/Time: 08/17/2018 8:07 AM Performed by: Kathryne Hitch, CRNA Pre-anesthesia Checklist: Patient identified, Emergency Drugs available, Patient being monitored, Suction available and Timeout performed Patient Re-evaluated:Patient Re-evaluated prior to induction Oxygen Delivery Method: Circle system utilized Preoxygenation: Pre-oxygenation with 100% oxygen Induction Type: IV induction Ventilation: Mask ventilation without difficulty LMA: LMA inserted LMA Size: 4.0 Number of attempts: 1 Placement Confirmation: positive ETCO2 and breath sounds checked- equal and bilateral Tube secured with: Tape

## 2018-08-17 NOTE — Op Note (Signed)
See dictation#[id number] SP CMC Left reconstr and forearm plate removal left  Ociel Retherford MD

## 2018-08-17 NOTE — H&P (Signed)
Tara Graves is an 52 y.o. female.   Chief Complaint: left thumb and forearm pain HPI: patient presents for left thumb CMC aplasty and left forearm hardware removal Patient presents for evaluation and treatment of the of their upper extremity predicament. The patient denies neck, back, chest or  abdominal pain. The patient notes that they have no lower extremity problems. The patients primary complaint is noted. We are planning surgical care pathway for the upper extremity.  Past Medical History:  Diagnosis Date  . Anxiety   . Arthritis    bilateral thumbs  . Fibromyalgia   . GERD (gastroesophageal reflux disease)   . History of hiatal hernia   . Hypertension   . Interstitial cystitis   . Lupus ([place])   . Myofascial muscle pain    chronic  . Uterine fibroid     Past Surgical History:  Procedure Laterality Date  . ABDOMINAL HYSTERECTOMY    . CESAREAN SECTION    . CHOLECYSTECTOMY    . COLONOSCOPY WITH ESOPHAGOGASTRODUODENOSCOPY (EGD)    . FRACTURE SURGERY Left    foot  . THUMB ARTHROSCOPY Right   . ULNAR SHORTENING WITH BONE GRAFT Bilateral   . WISDOM TOOTH EXTRACTION      Family History  Problem Relation Age of Onset  . Breast cancer Maternal Aunt    Social History:  reports that she has quit smoking. She has never used smokeless tobacco. She reports that she drinks about 6.0 standard drinks of alcohol per week. She reports that she does not use drugs.  Allergies:  Allergies  Allergen Reactions  . Flagyl [Metronidazole] Other (See Comments)    Very sick    Medications Prior to Admission  Medication Sig Dispense Refill  . ALPRAZolam (XANAX) 1 MG tablet Take 0.5 mg by mouth at bedtime.    Marland Kitchen Bioflavonoid Products (VITAMIN C) CHEW Chew 2 each by mouth daily.    . Black Cohosh 540 MG CAPS Take 540 mg by mouth at bedtime.    . fluticasone (FLONASE) 50 MCG/ACT nasal spray Place 1 spray into both nostrils at bedtime.    . Glycerin-Hypromellose-PEG 400 ([facility] DRY EYE  RELIEF OP) Place 1 drop into both eyes daily as needed (for dry eyes).    Marland Kitchen ibuprofen (ADVIL,MOTRIN) 200 MG tablet Take 200 mg by mouth every 6 (six) hours as needed for mild pain.    Marland Kitchen levocetirizine (XYZAL) 5 MG tablet Take 5 mg by mouth at bedtime.    . methocarbamol (ROBAXIN) 500 MG tablet Take 500 mg by mouth at bedtime.    . montelukast (SINGULAIR) 10 MG tablet Take 10 mg by mouth at bedtime.    Marland Kitchen NALTREXONE HCL PO Take 3 mg by mouth at bedtime.    Marland Kitchen OVER THE COUNTER MEDICATION Apply 1 application topically 2 (two) times daily. Ostab3-L4X Hormone Oil    . OVER THE COUNTER MEDICATION Place 1 suppository vaginally every other day. Hydration Oval 1X Plus Vaginal Suppository    . ranitidine (ZANTAC) 150 MG tablet Take 300 mg by mouth at bedtime.    . nitroGLYCERIN (NITROGLYN) 2 % ointment Apply 0.5 inches topically See admin instructions. Apply 1 application between toes once daily for 6-8 hours      No results found for this or any previous visit (from the past 48 hour(s)). No results found.  Review of Systems  Respiratory: Negative.   Cardiovascular: Negative.   Gastrointestinal: Negative.   Genitourinary: Negative.     Blood pressure (!) 135/101,  pulse 70, temperature 98.7 F (37.1 C), resp. rate 18, SpO2 100 %. Physical Exam  Left thunb DJD and left hardware retained in the forearm which is painful The patient is alert and oriented in no acute distress. The patient complains of pain in the affected upper extremity.  The patient is noted to have a normal HEENT exam. Lung fields show equal chest expansion and no shortness of breath. Abdomen exam is nontender without distention. Lower extremity examination does not show any fracture dislocation or blood clot symptoms. Pelvis is stable and the neck and back are stable and nontender. Assessment/Plan We are planning surgery for your upper extremity. The risk and benefits of surgery to include risk of bleeding, infection, anesthesia,   damage to normal structures and failure of the surgery to accomplish its intended goals of relieving symptoms and restoring function have been discussed in detail. With this in mind we plan to proceed. I have specifically discussed with the patient the pre-and postoperative regime and the dos and don'ts and risk and benefits in great detail. Risk and benefits of surgery also include risk of dystrophy(CRPS), chronic nerve pain, failure of the healing process to go onto completion and other inherent risks of surgery The relavent the pathophysiology of the disease/injury process, as well as the alternatives for treatment and postoperative course of action has been discussed in great detail with the patient who desires to proceed.  We will do everything in our power to help you (the patient) restore function to the upper extremity. It is a pleasure to see this patient today.  Plan for left thumb CMC Aplasty and hardware removal leftforearm with repair as needed  Willa Frater III, MD 08/17/2018, 6:40 AM

## 2018-08-17 NOTE — Op Note (Signed)
NAMESHAINA, GULLATT MEDICAL RECORD [id number] ACCOUNT [id number] DATE OF BIRTH:January 07, 1966 FACILITY: [facility] LOCATION: [facility] PHYSICIAN:Ashlye Oviedo M. Amedeo Plenty, MD  OPERATIVE REPORT  DATE OF PROCEDURE:  08/17/2018  PREOPERATIVE DIAGNOSES:   1.  Left forearm retained hardware after an ulnar shortening osteotomy. 2.  Left thumb chronic carpometacarpal arthritic change with failure of conservative management.  POSTOPERATIVE DIAGNOSIS:   1.  Left forearm retained hardware after an ulnar shortening osteotomy. 2.  Left thumb chronic carpometacarpal arthritic change with failure of conservative management.  PROCEDURE: 1.  Removal of deep hardware, left forearm. 2.  Right thumb CMC arthroplasty (removal of the trapezium at the base of the thumb joint region) -- carpectomy, left trapezium. 3.  Abductor pollicis longus digastric portion of tendon transfer to the FCR, APL proper and back upon itself with multiple figure-of-eight throws (Weilby tendon transfer) left basilar thumb joint. 4.  Abductor pollicis longus one-third proper portion of tendon transfer to the first metacarpal FCR and back upon itself in the first metacarpal through a drill hole secured with Bio-Tenodesis screw (Zancolli tendon transfer) left basilar thumb joint. 5.  Abductor pollicis longus tenodesis (shortening of wrist extensors at the wrist from level to prevent dorsal lateral escape) left thumb/wrist.  SURGEON:  [name], MD  ASSISTANT:  None.  COMPLICATIONS:  None.  ANESTHESIA:  General with preoperative block.  TOURNIQUET TIME:  Less than 90 minutes.  INDICATIONS:  A 52 year old female who presents with above-mentioned diagnosis.  She has had a similar set of procedures on the right side and presents for left-sided reconstructive efforts.  She understands risks and benefits and desires to proceed.  DESCRIPTION OF PROCEDURE:  The patient was seen by myself and anesthesia and taken to the operative theater  and underwent smooth induction of Hibiclens scrub to the arm after general anesthesia and block was placed.  Preoperative antibiotics were given.   Timeout was called and Betadine scrub and paint was then accomplished after the Hibiclens prescrub in the operative theater.  Sterile field was secured, outline marks were made visually, incision was made over the subcutaneous border of the ulna with  250 mm tourniquet control.  Dissection was carried down the interval between the ECU and FCU was created.  The plate was exposed on the ulna and it was removed in its entirety.  This was a [facility] ulnar shortening osteotomy plate.  Fixation was  excellently noted, but easily to retrieve the screws and removed the plate.  The bone was solid.  I irrigated copiously, closed the wound in layers of 3-0 Vicryl followed by subcuticular Prolene with Steri-Strips.  Following this, attention was turned  towards the thumb.  Separate incision was made over the dorsal radial aspect of the thumb.  Dissection was carried down princeps pollicis artery and superficial radial nerve and its branches were carefully dissected with 4.5 expanded loupe magnification.  The patient then  had exposure of the [facility] joint accomplished and at this time, I made an incision over the [facility] region and removed the trapezium piecemeal.  I then made a drill hole dorsal to palmar extending extra-articularly in line with the palmar beak ligament.  This  was enlarged to 3.0 mm drill hole.  This completed the carpectomy/trapezium excision portion of the procedure and arthroplasty.  Following this, I irrigated copiously.  I then made a counter incision at the dorsal distal third of the forearm and dissected down and retrieved the abductor pollicis digastric portion of tendon and the one-third proper portion  of the APL tendon.  These were retrieved through the main wound  and fiber loops were placed around these to nicely secure them.  I then placed  the APL proper beneath the area against the metacarpal.  I placed it through the drill hole dorsal to palmar around the FCR 3 times and then back through itself and into the  drill holes secured with Bio-Tenodesis screw from [employer].  Additional FiberWire sutures were placed and this completed the Zancolli tendon transfer.  Following this second tendon transfer, the abductor pollicis longus digastric portion was placed through the FCR and back upon the APL proper and with multiple loops through themselves completing the Weilby tendon transfer secured with 3-0 FiberWire.  Following this, I performed APL tenodesis to prevent dorsal lateral escape and a complex capsular tightening.  This was a shortening of wrist extensors, wrist from level to prevent dorsal lateral scalp.  The area was irrigated, tourniquet deflated at  less than 90 minutes total and the wounds closed with Prolene after secure examination under anesthesia revealing excellent stability about the thumb.  The superficial radial nerve and arterial structures all looked excellent and I was quite pleased.   There were no complicating features.  She will be admitted for IV antibiotics, general postoperative observation and pain management and discharged home tomorrow.  Standard postop algorithm for [facility] arthroplasty will be adhered to.  These notes have been  discussed and all questions have been encouraged and answered.  TN/NUANCE  D:08/17/2018 T:08/17/2018 JOB:[id number]

## 2018-08-18 DIAGNOSIS — M180 Bilateral primary osteoarthritis of first carpometacarpal joints: Secondary | ICD-10-CM | POA: Diagnosis not present

## 2018-08-18 DIAGNOSIS — T8484XA Pain due to internal orthopedic prosthetic devices, implants and grafts, initial encounter: Secondary | ICD-10-CM | POA: Diagnosis not present

## 2018-08-18 DIAGNOSIS — I10 Essential (primary) hypertension: Secondary | ICD-10-CM | POA: Diagnosis not present

## 2018-08-18 DIAGNOSIS — Z87891 Personal history of nicotine dependence: Secondary | ICD-10-CM | POA: Diagnosis not present

## 2018-08-18 DIAGNOSIS — Y831 Surgical operation with implant of artificial internal device as the cause of abnormal reaction of the patient, or of later complication, without mention of misadventure at the time of the procedure: Secondary | ICD-10-CM | POA: Diagnosis not present

## 2018-08-18 DIAGNOSIS — Z881 Allergy status to other antibiotic agents status: Secondary | ICD-10-CM | POA: Diagnosis not present

## 2018-08-18 DIAGNOSIS — F419 Anxiety disorder, unspecified: Secondary | ICD-10-CM | POA: Diagnosis not present

## 2018-08-18 DIAGNOSIS — Z79899 Other long term (current) drug therapy: Secondary | ICD-10-CM | POA: Diagnosis not present

## 2018-08-18 DIAGNOSIS — K219 Gastro-esophageal reflux disease without esophagitis: Secondary | ICD-10-CM | POA: Diagnosis not present

## 2018-08-18 MED ORDER — SCOPOLAMINE 1 MG/3DAYS TD PT72
1.0000 | MEDICATED_PATCH | TRANSDERMAL | Status: DC
  Administered 2018-08-18: 1.5 mg via TRANSDERMAL
  Filled 2018-08-18 (×2): qty 1

## 2018-08-18 MED ORDER — MORPHINE SULFATE (PF) 2 MG/ML IV SOLN
1.0000 mg | INTRAVENOUS | Status: DC | PRN
  Administered 2018-08-18: 1 mg via INTRAVENOUS
  Administered 2018-08-18 (×2): 2 mg via INTRAVENOUS
  Filled 2018-08-18 (×3): qty 1

## 2018-08-18 NOTE — Discharge Summary (Signed)
Physician Discharge Summary  Patient ID: Tara Graves MRN: [id number] DOB/AGE: 1966/09/09 52 y.o.  Admit date: 08/17/2018 Discharge date:   Admission Diagnoses: Left thumb carpometacarpal degenerative joint disease, left forearm hardware Past Medical History:  Diagnosis Date  . Anxiety   . Arthritis    bilateral thumbs  . Fibromyalgia   . GERD (gastroesophageal reflux disease)   . History of hiatal hernia   . Hypertension   . Interstitial cystitis   . Lupus ([place])   . Myofascial muscle pain    chronic  . Uterine fibroid     Discharge Diagnoses:  Active Problems:   Arthritis of carpometacarpal [facility]) joint of left thumb   Surgeries: Procedure(s): Left thumb carpometacarpal arthroplasty with double tendon transfer and left forearm hardware removal on 08/17/2018    Consultants:   Discharged Condition: Improved  Hospital Course: Tara Graves is an 52 y.o. female who was admitted 08/17/2018 with a chief complaint of No chief complaint on file. , and found to have a diagnosis of Left thumb carpometacarpal degenerative joint disease, left forearm hardware.  They were brought to the operating room on 08/17/2018 and underwent Procedure(s): Left thumb carpometacarpal arthroplasty with double tendon transfer and left forearm hardware removal.    They were given perioperative antibiotics:  Anti-infectives (From admission, onward)   Start     Dose/Rate Route Frequency Ordered Stop   08/17/18 1830  ceFAZolin (ANCEF) IVPB 1 g/50 mL premix     1 g 100 mL/hr over 30 Minutes Intravenous Every 8 hours 08/17/18 1123     08/17/18 1200  ceFAZolin (ANCEF) IVPB 1 g/50 mL premix     1 g 100 mL/hr over 30 Minutes Intravenous NOW 08/17/18 1123 08/18/18 1200   08/17/18 0600  ceFAZolin (ANCEF) IVPB 2g/100 mL premix     2 g 200 mL/hr over 30 Minutes Intravenous On call to O.R. 08/17/18 0538 08/17/18 0840   08/17/18 0540  ceFAZolin (ANCEF) 2-4 GM/100ML-% IVPB    Note to Pharmacy:   Debbe Bales, Meredit: cabinet override      08/17/18 0540 08/17/18 0810    .  They were given sequential compression devices, early ambulation, and Other (comment) for DVT prophylaxis.  Recent vital signs:  Patient Vitals for the past 24 hrs:  BP Temp Temp src Pulse SpO2 Height Weight  08/18/18 0700 109/78 (!) 97.4 F (36.3 C) Oral 67 98 % - -  08/17/18 1943 (!) 150/95 97.7 F (36.5 C) Oral 71 100 % - -  08/17/18 1700 - - - - - 5\' 5"  (1.651 m) 75.3 kg  .  Recent laboratory studies: No results found.  Discharge Medications:   Allergies as of 08/18/2018      Reactions   Flagyl [metronidazole] Other (See Comments)   Very sick      Medication List    TAKE these medications   ALPRAZolam 1 MG tablet Commonly known as:  XANAX Take 0.5 mg by mouth at bedtime.   Black Cohosh 540 MG Caps Take 540 mg by mouth at bedtime.   [employer] DRY EYE RELIEF OP Place 1 drop into both eyes daily as needed (for dry eyes).   fluticasone 50 MCG/ACT nasal spray Commonly known as:  FLONASE Place 1 spray into both nostrils at bedtime.   ibuprofen 200 MG tablet Commonly known as:  ADVIL,MOTRIN Take 200 mg by mouth every 6 (six) hours as needed for mild pain.   levocetirizine 5 MG tablet Commonly known as:  XYZAL Take 5  mg by mouth at bedtime.   methocarbamol 500 MG tablet Commonly known as:  ROBAXIN Take 500 mg by mouth at bedtime.   montelukast 10 MG tablet Commonly known as:  SINGULAIR Take 10 mg by mouth at bedtime.   NALTREXONE HCL PO Take 3 mg by mouth at bedtime.   nitroGLYCERIN 2 % ointment Commonly known as:  NITROGLYN Apply 0.5 inches topically See admin instructions. Apply 1 application between toes once daily for 6-8 hours   OVER THE COUNTER MEDICATION Apply 1 application topically 2 (two) times daily. Ostab3-L4X Hormone Oil   OVER THE COUNTER MEDICATION Place 1 suppository vaginally every other day. Hydration Oval 1X Plus Vaginal Suppository   ranitidine 150 MG  tablet Commonly known as:  ZANTAC Take 300 mg by mouth at bedtime.   Vitamin C Chew Chew 2 each by mouth daily.       Diagnostic Studies: No results found.  They benefited maximally from their hospital stay and there were no complications.     Disposition: Discharge disposition: 01-Home or Self Care      Discharge Instructions    Call MD / Call 911   Complete by:  As directed    If you experience chest pain or shortness of breath, CALL 911 and be transported to the hospital emergency room.  If you develope a fever above 101 F, pus (white drainage) or increased drainage or redness at the wound, or calf pain, call your surgeon's office.   Constipation Prevention   Complete by:  As directed    Drink plenty of fluids.  Prune juice may be helpful.  You may use a stool softener, such as Colace (over the counter) 100 mg twice a day.  Use MiraLax (over the counter) for constipation as needed.   Diet - low sodium heart healthy   Complete by:  As directed    Increase activity slowly as tolerated   Complete by:  As directed      Follow-up Information    [name], MD Follow up in 12 day(s).   Specialty:  Orthopedic Surgery Why:  we will call for your follow up Contact information: [address] [place] [address] [phone]          Patient did quite well status post surgical reconstruction left thumb and hardware removal left forearm.  She is stable awake alert and oriented.  We will plan to see her back in 12 to 14 days for follow-up.  Discharge medicines include oxycodone, Robaxin, Xanax.  At the time of discharge she was awake alert and oriented had no complicating features or problems.  No signs of DVT UTI or other issues.  She is awake alert and oriented and neurovascularly intact about the extremity.  Final diagnosis status post left basilar thumb joint replacement with hardware removal about the forearm secondary to a previous ulnar  shortening osteotomy  Signed: Willa Frater III 08/18/2018, 12:25 PM

## 2018-08-18 NOTE — Evaluation (Signed)
Occupational Therapy Evaluation Patient Details Name: Tara Graves MRN: [id number] DOB: 02/26/1966 Today's Date: 08/18/2018    History of Present Illness This 52 y.o. female admitted  for Lt thumb carpometacarpal arthroplasty with double tendon transfer and Lt forearm hardware removal. due to Lt Harmony DJD.  PMH includes:  myofascial muscle pain, Lupus, insterstitial cystitis, HNT, fibromyalgia, anxiety    Clinical Impression   Patient evaluated by Occupational Therapy with no further acute OT needs identified. All education has been completed and the patient has no further questions. All education completed.  She is able to perform ADLs mod I, and is able to independently position Lt UE, and perform edema management.  See below for any follow-up Occupational Therapy or equipment needs. OT is signing off. Thank you for this referral.      Follow Up Recommendations  Supervision - Intermittent;Follow surgeon's recommendation for DC plan and follow-up therapies    Equipment Recommendations  None recommended by OT    Recommendations for Other Services       Precautions / Restrictions Precautions Precautions: Other (comment) Precaution Comments: Lt wrist and thumb immobilized.  Edema management  Restrictions Weight Bearing Restrictions: Yes LUE Weight Bearing: Non weight bearing      Mobility Bed Mobility Overal bed mobility: Modified Independent             General bed mobility comments: HOB elevated   Transfers Overall transfer level: Independent                    Balance                                           ADL either performed or assessed with clinical judgement   ADL Overall ADL's : Modified independent                                       General ADL Comments: Pt knows to thread Lt UE through sleeve first      Vision         Perception     Praxis      Pertinent Vitals/Pain Pain Assessment:  0-10 Pain Score: 8  Pain Location: Lt hand  Pain Descriptors / Indicators: Aching;Constant;Operative site guarding;Stabbing Pain Intervention(s): Monitored during session;Repositioned;Premedicated before session     Hand Dominance Right   Extremity/Trunk Assessment Upper Extremity Assessment Upper Extremity Assessment: LUE deficits/detail LUE Deficits / Details: Lt shoulder and elbow grossly WFL.  Able to flex extend digits II-V.   LUE Coordination: decreased fine motor   Lower Extremity Assessment Lower Extremity Assessment: Overall WFL for tasks assessed   Cervical / Trunk Assessment Cervical / Trunk Assessment: Normal   Communication Communication Communication: No difficulties   Cognition Arousal/Alertness: Awake/alert Behavior During Therapy: WFL for tasks assessed/performed Overall Cognitive Status: Within Functional Limits for tasks assessed                                     General Comments  Pt reports she has had multiple surgeries on both hands and knows how to perform ADLs one handedly and how to don/doff sling.  She is complaining of significant pain and unable to get comfortable.  She is  aware of need for elevation and edema control.  Kuzma sling ordered in attempts to better position pt and ice packs applied inside sling.  Able to unweight Lt UE while keeping it elvated in hopes of reducing pressure and pain.   Attempted multiple times to reposition     Exercises Exercises: Other exercises Other Exercises Other Exercises: Pt is able to perform finger flex/ext independently    Shoulder Instructions      Home Living Family/patient expects to be discharged to:: Private residence Living Arrangements: Spouse/significant other                                      Prior Functioning/Environment Level of Independence: Independent                 OT Problem List: Decreased strength;Decreased activity tolerance;Decreased range  of motion;Obesity;Impaired UE functional use      OT Treatment/Interventions:      OT Goals(Current goals can be found in the care plan section) Acute Rehab OT Goals Patient Stated Goal: to get pain under control  OT Goal Formulation: All assessment and education complete, DC therapy  OT Frequency:     Barriers to D/C:            Co-evaluation              AM-PAC OT "6 Clicks" Daily Activity     Outcome Measure Help from another person eating meals?: None Help from another person taking care of personal grooming?: None Help from another person toileting, which includes using toliet, bedpan, or urinal?: None Help from another person bathing (including washing, rinsing, drying)?: None Help from another person to put on and taking off regular upper body clothing?: None Help from another person to put on and taking off regular lower body clothing?: None 6 Click Score: 24   End of Session Equipment Utilized During Treatment: Other (comment)(sling ) Nurse Communication: Patient requests pain meds  Activity Tolerance: Patient limited by pain Patient left: in bed;with call bell/phone within reach;with family/visitor present  OT Visit Diagnosis: Pain Pain - Right/Left: Left Pain - part of body: Hand                Time: 1443-1540 OT Time Calculation (min): 52 min Charges:  OT General Charges $OT Visit: 1 Visit OT Evaluation $OT Eval Moderate Complexity: 1 Mod OT Treatments $Therapeutic Activity: 23-37 mins  Lucille Passy, OTR/L Acute Rehabilitation Services Pager [phone] Office [phone]   Lucille Passy M 08/18/2018, 2:12 PM

## 2018-08-18 NOTE — Progress Notes (Signed)
Nsg Discharge Note  Admit Date:  08/17/2018 Discharge date: 08/18/2018   Palma Holter to be D/C'd Home per MD order.  AVS completed.  Copy for chart, and copy for patient signed, and dated. Patient/caregiver able to verbalize understanding.  Discharge Medication:   Discharge Assessment: Vitals:   08/18/18 0700 08/18/18 1418  BP: 109/78 (!) 128/97  Pulse: 67 60  Resp:  18  Temp: (!) 97.4 F (36.3 C) 99 F (37.2 C)  SpO2: 98% 100%   Skin clean, dry and intact without evidence of skin break down, no evidence of skin tears noted. IV catheter discontinued intact. Site without signs and symptoms of complications - no redness or edema noted at insertion site, patient denies c/o pain - only slight tenderness at site.  Dressing with slight pressure applied.  D/c Instructions-Education: Discharge instructions given to patient/family with verbalized understanding. D/c education completed with patient/family including follow up instructions, medication list, d/c activities limitations if indicated, with other d/c instructions as indicated by MD - patient able to verbalize understanding, all questions fully answered. Patient instructed to return to ED, call 911, or call MD for any changes in condition.  Patient escorted via [place], and D/C home via private auto.  Eda Keys, RN 08/18/2018 2:39 PM

## 2018-08-18 NOTE — Progress Notes (Signed)
Paged MD with pt's request of scopalamine patch. Awaiting call back.

## 2018-08-18 NOTE — Discharge Instructions (Signed)
°  Please resume your regular medicines as you normally take them.  We have called in Robaxin oxycodone and Xanax to your pharmacy    Keep bandage clean and dry.  Call for any problems.  No smoking.  Criteria for driving a car: you should be off your pain medicine for 7-8 hours, able to drive one handed(confident), thinking clearly and feeling able in your judgement to drive. Continue elevation as it will decrease swelling.  If instructed by MD move your fingers within the confines of the bandage/splint.  Use ice if instructed by your MD. Call immediately for any sudden loss of feeling in your hand/arm or change in functional abilities of the extremity.We recommend that you to take vitamin C 1000 mg a day to promote healing. We also recommend that if you require  pain medicine that you take a stool softener to prevent constipation as most pain medicines will have constipation side effects. We recommend either Peri-Colace or Senokot and recommend that you also consider adding MiraLAX as well to prevent the constipation affects from pain medicine if you are required to use them. These medicines are over the counter and may be purchased at a local pharmacy. A cup of yogurt and a probiotic can also be helpful during the recovery process as the medicines can disrupt your intestinal environment.

## 2018-08-18 NOTE — Progress Notes (Signed)
Orthopedic Tech Progress Note Patient Details:  Tara Graves 1966-01-23 [id number]  Ortho Devices Type of Ortho Device: Sling arm elevator       [name] 08/18/2018, 10:06 AM

## 2018-08-18 NOTE — Progress Notes (Signed)
Pt stating that her pain is not being resolved by the ordered pain medications. This RN called Dr. Amedeo Plenty and took verbal orders to d/c hydromorphone IV and add Morphine (see eMAR).

## 2018-08-20 ENCOUNTER — Encounter (HOSPITAL_COMMUNITY): Payer: Self-pay | Admitting: Orthopedic Surgery

## 2018-08-24 NOTE — Addendum Note (Signed)
Addendum  created 08/24/18 0833 by Audry Pili, MD   Intraprocedure Blocks edited, Sign clinical note

## 2018-08-30 DIAGNOSIS — I7300 Raynaud's syndrome without gangrene: Secondary | ICD-10-CM | POA: Diagnosis not present

## 2018-08-30 DIAGNOSIS — M3210 Systemic lupus erythematosus, organ or system involvement unspecified: Secondary | ICD-10-CM | POA: Diagnosis not present

## 2018-08-30 DIAGNOSIS — M1811 Unilateral primary osteoarthritis of first carpometacarpal joint, right hand: Secondary | ICD-10-CM | POA: Diagnosis not present

## 2018-08-30 DIAGNOSIS — M797 Fibromyalgia: Secondary | ICD-10-CM | POA: Diagnosis not present

## 2018-09-13 DIAGNOSIS — Z79899 Other long term (current) drug therapy: Secondary | ICD-10-CM | POA: Diagnosis not present

## 2018-09-13 DIAGNOSIS — H353111 Nonexudative age-related macular degeneration, right eye, early dry stage: Secondary | ICD-10-CM | POA: Diagnosis not present

## 2018-09-13 DIAGNOSIS — M79645 Pain in left finger(s): Secondary | ICD-10-CM | POA: Diagnosis not present

## 2018-09-13 DIAGNOSIS — M1811 Unilateral primary osteoarthritis of first carpometacarpal joint, right hand: Secondary | ICD-10-CM | POA: Diagnosis not present

## 2018-10-01 DIAGNOSIS — M79645 Pain in left finger(s): Secondary | ICD-10-CM | POA: Diagnosis not present

## 2018-10-15 DIAGNOSIS — M79645 Pain in left finger(s): Secondary | ICD-10-CM | POA: Diagnosis not present

## 2018-12-06 DIAGNOSIS — E663 Overweight: Secondary | ICD-10-CM | POA: Diagnosis not present

## 2018-12-06 DIAGNOSIS — Z131 Encounter for screening for diabetes mellitus: Secondary | ICD-10-CM | POA: Diagnosis not present

## 2018-12-06 DIAGNOSIS — Z1331 Encounter for screening for depression: Secondary | ICD-10-CM | POA: Diagnosis not present

## 2018-12-06 DIAGNOSIS — Z1231 Encounter for screening mammogram for malignant neoplasm of breast: Secondary | ICD-10-CM | POA: Diagnosis not present

## 2018-12-06 DIAGNOSIS — Z0000 Encounter for general adult medical examination without abnormal findings: Secondary | ICD-10-CM | POA: Diagnosis not present

## 2019-01-10 DIAGNOSIS — M3210 Systemic lupus erythematosus, organ or system involvement unspecified: Secondary | ICD-10-CM | POA: Diagnosis not present

## 2019-07-15 DIAGNOSIS — M3210 Systemic lupus erythematosus, organ or system involvement unspecified: Secondary | ICD-10-CM | POA: Diagnosis not present

## 2019-07-15 DIAGNOSIS — Z79899 Other long term (current) drug therapy: Secondary | ICD-10-CM | POA: Diagnosis not present

## 2019-08-13 DIAGNOSIS — I7300 Raynaud's syndrome without gangrene: Secondary | ICD-10-CM | POA: Diagnosis not present

## 2019-08-13 DIAGNOSIS — M3210 Systemic lupus erythematosus, organ or system involvement unspecified: Secondary | ICD-10-CM | POA: Diagnosis not present

## 2019-08-13 DIAGNOSIS — Z79899 Other long term (current) drug therapy: Secondary | ICD-10-CM | POA: Diagnosis not present

## 2019-11-05 ENCOUNTER — Other Ambulatory Visit: Payer: Self-pay | Admitting: Internal Medicine

## 2019-11-05 ENCOUNTER — Other Ambulatory Visit: Payer: Self-pay | Admitting: Family

## 2019-11-05 DIAGNOSIS — Z1231 Encounter for screening mammogram for malignant neoplasm of breast: Secondary | ICD-10-CM

## 2019-12-03 ENCOUNTER — Ambulatory Visit: Payer: BC Managed Care – PPO

## 2019-12-17 DIAGNOSIS — Z1331 Encounter for screening for depression: Secondary | ICD-10-CM | POA: Diagnosis not present

## 2019-12-17 DIAGNOSIS — N951 Menopausal and female climacteric states: Secondary | ICD-10-CM | POA: Diagnosis not present

## 2019-12-17 DIAGNOSIS — Z131 Encounter for screening for diabetes mellitus: Secondary | ICD-10-CM | POA: Diagnosis not present

## 2019-12-17 DIAGNOSIS — Z0000 Encounter for general adult medical examination without abnormal findings: Secondary | ICD-10-CM | POA: Diagnosis not present

## 2019-12-17 DIAGNOSIS — Z13220 Encounter for screening for lipoid disorders: Secondary | ICD-10-CM | POA: Diagnosis not present

## 2020-01-15 DIAGNOSIS — B029 Zoster without complications: Secondary | ICD-10-CM | POA: Diagnosis not present

## 2020-01-20 DIAGNOSIS — L608 Other nail disorders: Secondary | ICD-10-CM | POA: Diagnosis not present

## 2020-01-20 DIAGNOSIS — B029 Zoster without complications: Secondary | ICD-10-CM | POA: Diagnosis not present

## 2020-01-28 DIAGNOSIS — L03032 Cellulitis of left toe: Secondary | ICD-10-CM | POA: Diagnosis not present

## 2020-01-28 DIAGNOSIS — M79675 Pain in left toe(s): Secondary | ICD-10-CM | POA: Diagnosis not present

## 2020-01-28 DIAGNOSIS — M792 Neuralgia and neuritis, unspecified: Secondary | ICD-10-CM | POA: Diagnosis not present

## 2020-01-28 DIAGNOSIS — L0291 Cutaneous abscess, unspecified: Secondary | ICD-10-CM | POA: Diagnosis not present

## 2020-02-06 ENCOUNTER — Ambulatory Visit: Payer: BC Managed Care – PPO

## 2020-03-11 ENCOUNTER — Other Ambulatory Visit: Payer: Self-pay

## 2020-03-11 ENCOUNTER — Ambulatory Visit
Admission: RE | Admit: 2020-03-11 | Discharge: 2020-03-11 | Disposition: A | Discharge status: Home / Self Care | Payer: BC Managed Care – PPO | Source: Ambulatory Visit | Attending: Family | Admitting: Family

## 2020-03-11 DIAGNOSIS — Z1231 Encounter for screening mammogram for malignant neoplasm of breast: Secondary | ICD-10-CM

## 2020-04-27 DIAGNOSIS — Z03818 Encounter for observation for suspected exposure to other biological agents ruled out: Secondary | ICD-10-CM | POA: Diagnosis not present

## 2020-04-30 DIAGNOSIS — M797 Fibromyalgia: Secondary | ICD-10-CM | POA: Diagnosis not present

## 2020-04-30 DIAGNOSIS — Z79899 Other long term (current) drug therapy: Secondary | ICD-10-CM | POA: Diagnosis not present

## 2020-04-30 DIAGNOSIS — M3210 Systemic lupus erythematosus, organ or system involvement unspecified: Secondary | ICD-10-CM | POA: Diagnosis not present

## 2020-04-30 DIAGNOSIS — I7300 Raynaud's syndrome without gangrene: Secondary | ICD-10-CM | POA: Diagnosis not present

## 2020-05-02 DIAGNOSIS — I7300 Raynaud's syndrome without gangrene: Secondary | ICD-10-CM | POA: Insufficient documentation

## 2020-06-02 DIAGNOSIS — Z20828 Contact with and (suspected) exposure to other viral communicable diseases: Secondary | ICD-10-CM | POA: Diagnosis not present

## 2020-07-01 DIAGNOSIS — J302 Other seasonal allergic rhinitis: Secondary | ICD-10-CM | POA: Diagnosis not present

## 2020-07-01 DIAGNOSIS — M328 Other forms of systemic lupus erythematosus: Secondary | ICD-10-CM | POA: Diagnosis not present

## 2020-07-01 DIAGNOSIS — K219 Gastro-esophageal reflux disease without esophagitis: Secondary | ICD-10-CM | POA: Diagnosis not present

## 2020-07-01 DIAGNOSIS — F419 Anxiety disorder, unspecified: Secondary | ICD-10-CM | POA: Diagnosis not present

## 2020-07-01 DIAGNOSIS — E7800 Pure hypercholesterolemia, unspecified: Secondary | ICD-10-CM | POA: Diagnosis not present

## 2020-09-19 HISTORY — PX: GREAT TOE ARTHRODESIS, INTERPHALANGEAL JOINT: SUR55

## 2020-09-28 DIAGNOSIS — Z20828 Contact with and (suspected) exposure to other viral communicable diseases: Secondary | ICD-10-CM | POA: Diagnosis not present

## 2020-12-16 DIAGNOSIS — Z6825 Body mass index (BMI) 25.0-25.9, adult: Secondary | ICD-10-CM | POA: Diagnosis not present

## 2020-12-16 DIAGNOSIS — Z0001 Encounter for general adult medical examination with abnormal findings: Secondary | ICD-10-CM | POA: Diagnosis not present

## 2020-12-16 DIAGNOSIS — E7800 Pure hypercholesterolemia, unspecified: Secondary | ICD-10-CM | POA: Diagnosis not present

## 2020-12-16 DIAGNOSIS — E2839 Other primary ovarian failure: Secondary | ICD-10-CM | POA: Diagnosis not present

## 2020-12-16 DIAGNOSIS — Z1331 Encounter for screening for depression: Secondary | ICD-10-CM | POA: Diagnosis not present

## 2020-12-16 DIAGNOSIS — Z131 Encounter for screening for diabetes mellitus: Secondary | ICD-10-CM | POA: Diagnosis not present

## 2021-01-12 DIAGNOSIS — M3210 Systemic lupus erythematosus, organ or system involvement unspecified: Secondary | ICD-10-CM | POA: Diagnosis not present

## 2021-03-15 DIAGNOSIS — F4310 Post-traumatic stress disorder, unspecified: Secondary | ICD-10-CM | POA: Diagnosis not present

## 2021-03-24 DIAGNOSIS — F4310 Post-traumatic stress disorder, unspecified: Secondary | ICD-10-CM | POA: Diagnosis not present

## 2021-03-30 DIAGNOSIS — M19072 Primary osteoarthritis, left ankle and foot: Secondary | ICD-10-CM | POA: Diagnosis not present

## 2021-04-01 DIAGNOSIS — F4310 Post-traumatic stress disorder, unspecified: Secondary | ICD-10-CM | POA: Diagnosis not present

## 2021-04-08 DIAGNOSIS — F4310 Post-traumatic stress disorder, unspecified: Secondary | ICD-10-CM | POA: Diagnosis not present

## 2021-04-15 DIAGNOSIS — F4310 Post-traumatic stress disorder, unspecified: Secondary | ICD-10-CM | POA: Diagnosis not present

## 2021-04-22 DIAGNOSIS — F4310 Post-traumatic stress disorder, unspecified: Secondary | ICD-10-CM | POA: Diagnosis not present

## 2021-04-29 DIAGNOSIS — F4310 Post-traumatic stress disorder, unspecified: Secondary | ICD-10-CM | POA: Diagnosis not present

## 2021-05-06 DIAGNOSIS — F4310 Post-traumatic stress disorder, unspecified: Secondary | ICD-10-CM | POA: Diagnosis not present

## 2021-05-06 DIAGNOSIS — M7711 Lateral epicondylitis, right elbow: Secondary | ICD-10-CM | POA: Diagnosis not present

## 2021-05-12 DIAGNOSIS — F4310 Post-traumatic stress disorder, unspecified: Secondary | ICD-10-CM | POA: Diagnosis not present

## 2021-05-14 DIAGNOSIS — M2022 Hallux rigidus, left foot: Secondary | ICD-10-CM | POA: Diagnosis not present

## 2021-05-19 DIAGNOSIS — F4310 Post-traumatic stress disorder, unspecified: Secondary | ICD-10-CM | POA: Diagnosis not present

## 2021-05-26 DIAGNOSIS — F4310 Post-traumatic stress disorder, unspecified: Secondary | ICD-10-CM | POA: Diagnosis not present

## 2021-05-31 DIAGNOSIS — F4310 Post-traumatic stress disorder, unspecified: Secondary | ICD-10-CM | POA: Diagnosis not present

## 2021-06-01 DIAGNOSIS — M2022 Hallux rigidus, left foot: Secondary | ICD-10-CM | POA: Diagnosis not present

## 2021-06-01 DIAGNOSIS — G8918 Other acute postprocedural pain: Secondary | ICD-10-CM | POA: Diagnosis not present

## 2021-06-09 DIAGNOSIS — R42 Dizziness and giddiness: Secondary | ICD-10-CM | POA: Diagnosis not present

## 2021-06-09 DIAGNOSIS — H8110 Benign paroxysmal vertigo, unspecified ear: Secondary | ICD-10-CM | POA: Diagnosis not present

## 2021-06-16 DIAGNOSIS — F4310 Post-traumatic stress disorder, unspecified: Secondary | ICD-10-CM | POA: Diagnosis not present

## 2021-06-23 DIAGNOSIS — F4310 Post-traumatic stress disorder, unspecified: Secondary | ICD-10-CM | POA: Diagnosis not present

## 2021-06-30 DIAGNOSIS — F4310 Post-traumatic stress disorder, unspecified: Secondary | ICD-10-CM | POA: Diagnosis not present

## 2021-07-01 DIAGNOSIS — F419 Anxiety disorder, unspecified: Secondary | ICD-10-CM | POA: Diagnosis not present

## 2021-07-07 DIAGNOSIS — F4310 Post-traumatic stress disorder, unspecified: Secondary | ICD-10-CM | POA: Diagnosis not present

## 2021-07-14 DIAGNOSIS — F4310 Post-traumatic stress disorder, unspecified: Secondary | ICD-10-CM | POA: Diagnosis not present

## 2021-07-14 DIAGNOSIS — M2022 Hallux rigidus, left foot: Secondary | ICD-10-CM | POA: Diagnosis not present

## 2021-07-21 DIAGNOSIS — F4310 Post-traumatic stress disorder, unspecified: Secondary | ICD-10-CM | POA: Diagnosis not present

## 2021-07-27 DIAGNOSIS — F4310 Post-traumatic stress disorder, unspecified: Secondary | ICD-10-CM | POA: Diagnosis not present

## 2021-07-30 DIAGNOSIS — F4310 Post-traumatic stress disorder, unspecified: Secondary | ICD-10-CM | POA: Diagnosis not present

## 2021-08-03 DIAGNOSIS — F4310 Post-traumatic stress disorder, unspecified: Secondary | ICD-10-CM | POA: Diagnosis not present

## 2021-08-06 DIAGNOSIS — F4310 Post-traumatic stress disorder, unspecified: Secondary | ICD-10-CM | POA: Diagnosis not present

## 2021-08-10 DIAGNOSIS — H353111 Nonexudative age-related macular degeneration, right eye, early dry stage: Secondary | ICD-10-CM | POA: Diagnosis not present

## 2021-08-16 DIAGNOSIS — M79672 Pain in left foot: Secondary | ICD-10-CM | POA: Diagnosis not present

## 2021-08-17 DIAGNOSIS — F4310 Post-traumatic stress disorder, unspecified: Secondary | ICD-10-CM | POA: Diagnosis not present

## 2021-08-19 DIAGNOSIS — F4310 Post-traumatic stress disorder, unspecified: Secondary | ICD-10-CM | POA: Diagnosis not present

## 2021-08-24 DIAGNOSIS — F4310 Post-traumatic stress disorder, unspecified: Secondary | ICD-10-CM | POA: Diagnosis not present

## 2021-08-25 DIAGNOSIS — M25521 Pain in right elbow: Secondary | ICD-10-CM | POA: Diagnosis not present

## 2021-08-27 DIAGNOSIS — M25521 Pain in right elbow: Secondary | ICD-10-CM | POA: Diagnosis not present

## 2021-08-27 DIAGNOSIS — F4310 Post-traumatic stress disorder, unspecified: Secondary | ICD-10-CM | POA: Diagnosis not present

## 2021-08-30 DIAGNOSIS — F4310 Post-traumatic stress disorder, unspecified: Secondary | ICD-10-CM | POA: Diagnosis not present

## 2021-08-31 DIAGNOSIS — M25521 Pain in right elbow: Secondary | ICD-10-CM | POA: Diagnosis not present

## 2021-09-02 DIAGNOSIS — F4310 Post-traumatic stress disorder, unspecified: Secondary | ICD-10-CM | POA: Diagnosis not present

## 2021-09-03 ENCOUNTER — Other Ambulatory Visit: Payer: Self-pay | Admitting: Family

## 2021-09-03 DIAGNOSIS — Z1231 Encounter for screening mammogram for malignant neoplasm of breast: Secondary | ICD-10-CM

## 2021-09-03 DIAGNOSIS — M25521 Pain in right elbow: Secondary | ICD-10-CM | POA: Diagnosis not present

## 2021-09-06 DIAGNOSIS — M25521 Pain in right elbow: Secondary | ICD-10-CM | POA: Diagnosis not present

## 2021-09-06 DIAGNOSIS — F4310 Post-traumatic stress disorder, unspecified: Secondary | ICD-10-CM | POA: Diagnosis not present

## 2021-09-08 DIAGNOSIS — M25521 Pain in right elbow: Secondary | ICD-10-CM | POA: Diagnosis not present

## 2021-09-09 DIAGNOSIS — F4310 Post-traumatic stress disorder, unspecified: Secondary | ICD-10-CM | POA: Diagnosis not present

## 2021-09-14 DIAGNOSIS — M25521 Pain in right elbow: Secondary | ICD-10-CM | POA: Diagnosis not present

## 2021-09-16 DIAGNOSIS — M2022 Hallux rigidus, left foot: Secondary | ICD-10-CM | POA: Diagnosis not present

## 2021-09-17 DIAGNOSIS — M25521 Pain in right elbow: Secondary | ICD-10-CM | POA: Diagnosis not present

## 2021-10-06 ENCOUNTER — Ambulatory Visit
Admission: RE | Admit: 2021-10-06 | Discharge: 2021-10-06 | Disposition: A | Discharge status: Home / Self Care | Payer: BC Managed Care – PPO | Source: Ambulatory Visit

## 2021-10-06 ENCOUNTER — Other Ambulatory Visit: Payer: Self-pay

## 2021-10-06 DIAGNOSIS — Z1231 Encounter for screening mammogram for malignant neoplasm of breast: Secondary | ICD-10-CM

## 2021-11-26 DIAGNOSIS — Z7689 Persons encountering health services in other specified circumstances: Secondary | ICD-10-CM | POA: Diagnosis not present

## 2021-11-26 DIAGNOSIS — Z6827 Body mass index (BMI) 27.0-27.9, adult: Secondary | ICD-10-CM | POA: Diagnosis not present

## 2021-11-26 DIAGNOSIS — M329 Systemic lupus erythematosus, unspecified: Secondary | ICD-10-CM | POA: Diagnosis not present

## 2021-12-31 DIAGNOSIS — M329 Systemic lupus erythematosus, unspecified: Secondary | ICD-10-CM | POA: Diagnosis not present

## 2021-12-31 DIAGNOSIS — Z6827 Body mass index (BMI) 27.0-27.9, adult: Secondary | ICD-10-CM | POA: Diagnosis not present

## 2021-12-31 DIAGNOSIS — Z0001 Encounter for general adult medical examination with abnormal findings: Secondary | ICD-10-CM | POA: Diagnosis not present

## 2021-12-31 DIAGNOSIS — Z136 Encounter for screening for cardiovascular disorders: Secondary | ICD-10-CM | POA: Diagnosis not present

## 2021-12-31 DIAGNOSIS — Z23 Encounter for immunization: Secondary | ICD-10-CM | POA: Diagnosis not present

## 2021-12-31 DIAGNOSIS — Z1321 Encounter for screening for nutritional disorder: Secondary | ICD-10-CM | POA: Diagnosis not present

## 2021-12-31 DIAGNOSIS — Z13220 Encounter for screening for lipoid disorders: Secondary | ICD-10-CM | POA: Diagnosis not present

## 2022-02-17 DIAGNOSIS — M7711 Lateral epicondylitis, right elbow: Secondary | ICD-10-CM | POA: Diagnosis not present

## 2022-04-04 DIAGNOSIS — Z23 Encounter for immunization: Secondary | ICD-10-CM | POA: Diagnosis not present

## 2022-07-04 DIAGNOSIS — E673 Hypervitaminosis D: Secondary | ICD-10-CM | POA: Insufficient documentation

## 2022-07-04 DIAGNOSIS — Z1339 Encounter for screening examination for other mental health and behavioral disorders: Secondary | ICD-10-CM | POA: Diagnosis not present

## 2022-07-04 DIAGNOSIS — M329 Systemic lupus erythematosus, unspecified: Secondary | ICD-10-CM | POA: Diagnosis not present

## 2022-07-04 DIAGNOSIS — Z13220 Encounter for screening for lipoid disorders: Secondary | ICD-10-CM | POA: Diagnosis not present

## 2022-07-04 DIAGNOSIS — Z1331 Encounter for screening for depression: Secondary | ICD-10-CM | POA: Diagnosis not present

## 2023-01-27 ENCOUNTER — Other Ambulatory Visit: Payer: Self-pay | Admitting: Student

## 2023-01-27 DIAGNOSIS — Z1231 Encounter for screening mammogram for malignant neoplasm of breast: Secondary | ICD-10-CM

## 2023-02-03 ENCOUNTER — Inpatient Hospital Stay: Admission: RE | Admit: 2023-02-03 | Payer: BC Managed Care – PPO | Source: Ambulatory Visit

## 2023-02-28 ENCOUNTER — Other Ambulatory Visit: Payer: Self-pay | Admitting: Student

## 2023-02-28 DIAGNOSIS — Z1231 Encounter for screening mammogram for malignant neoplasm of breast: Secondary | ICD-10-CM

## 2023-03-03 ENCOUNTER — Ambulatory Visit
Admission: RE | Admit: 2023-03-03 | Discharge: 2023-03-03 | Disposition: A | Discharge status: Home / Self Care | Payer: BC Managed Care – PPO | Source: Ambulatory Visit | Attending: Family | Admitting: Family

## 2023-03-03 DIAGNOSIS — Z1231 Encounter for screening mammogram for malignant neoplasm of breast: Secondary | ICD-10-CM

## 2023-03-23 IMAGING — MG MM DIGITAL SCREENING BILAT W/ TOMO AND CAD
8 series · 9 of 24 positions shown · non-contrast
Comparison: Previous exam(s).

CLINICAL DATA: Screening.

EXAM:
DIGITAL SCREENING BILATERAL MAMMOGRAM WITH TOMOSYNTHESIS AND CAD
TECHNIQUE: Bilateral screening digital craniocaudal and mediolateral oblique
mammograms were obtained. Bilateral screening digital breast
tomosynthesis was performed. The images were evaluated with
computer-aided detection.

[R CC synth-2D]
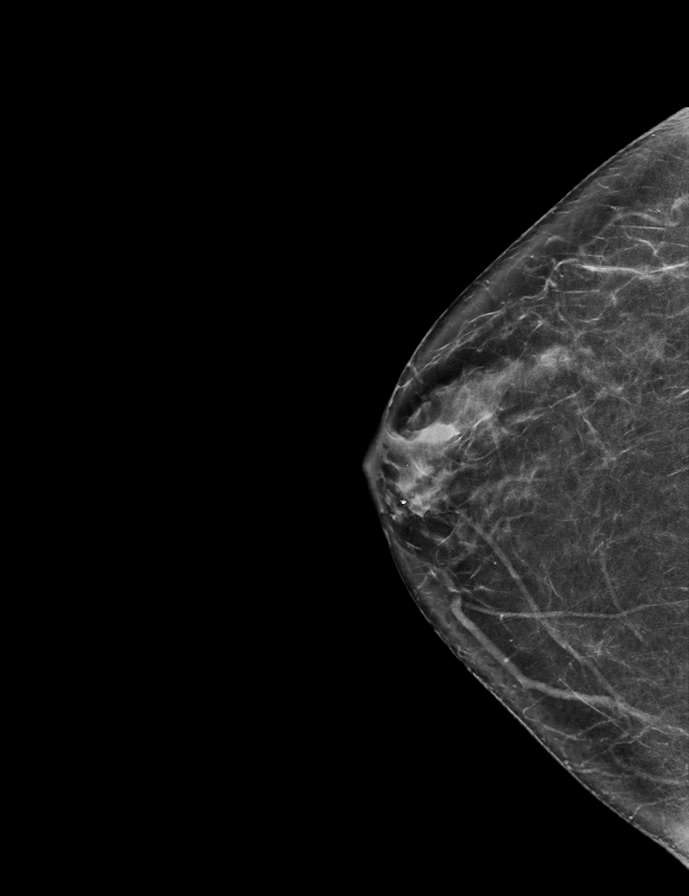

[R MLO synth-2D]
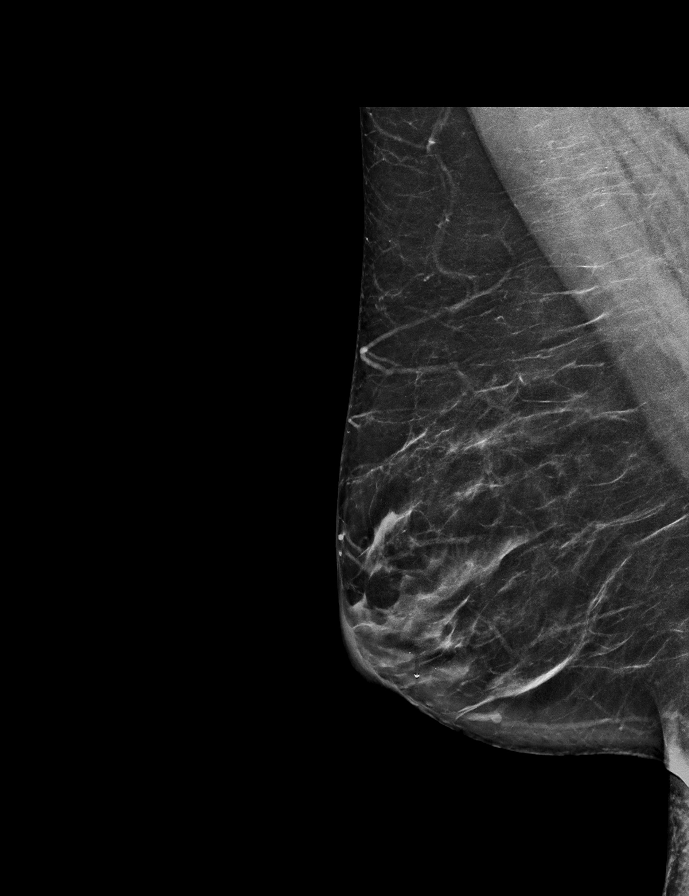

[L MLO synth-2D]
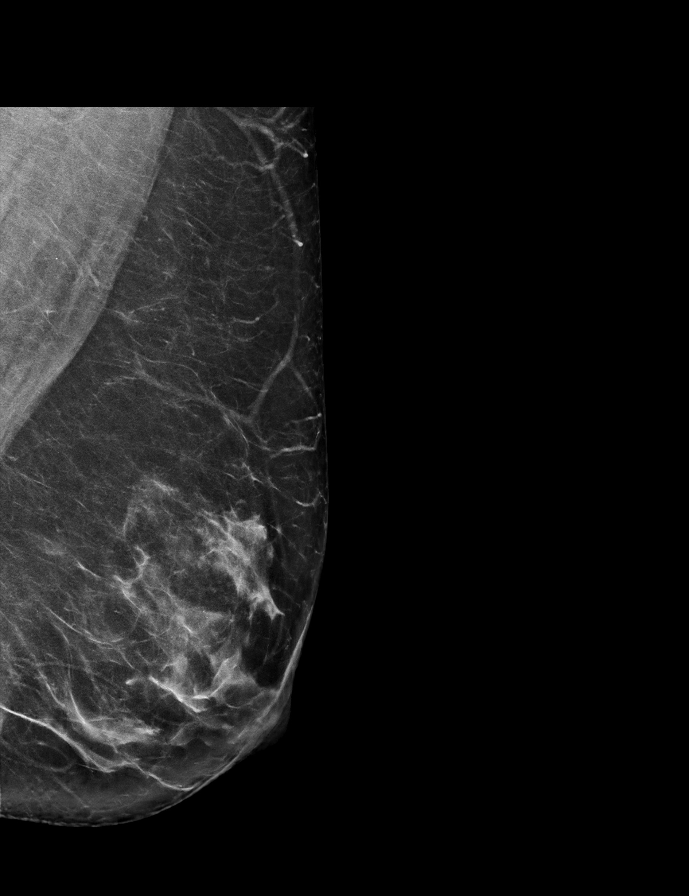

[L CC synth-2D]
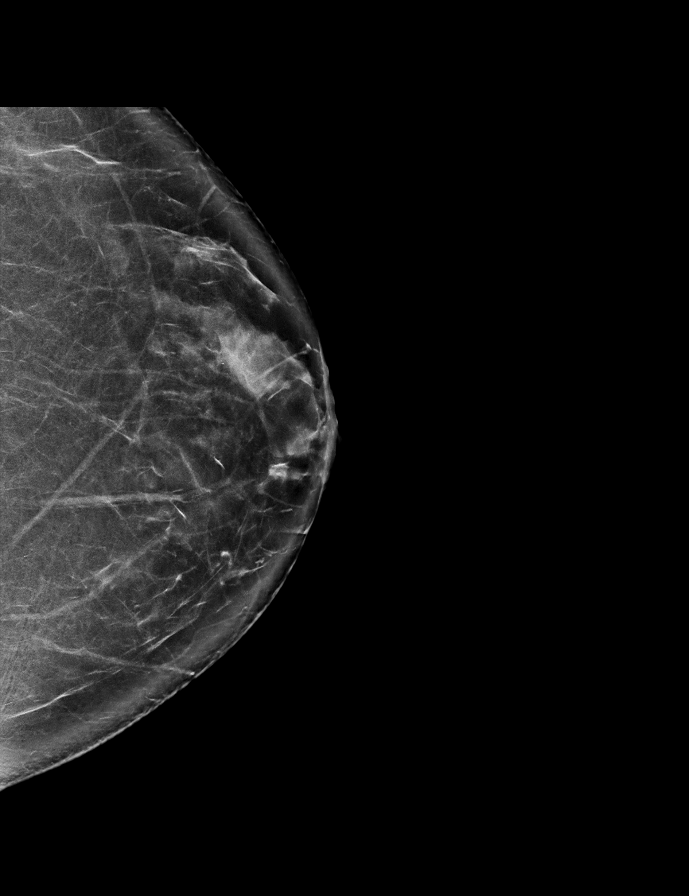

[L MLO tomo · 2 of 79 frames shown]
[frame 26/79]
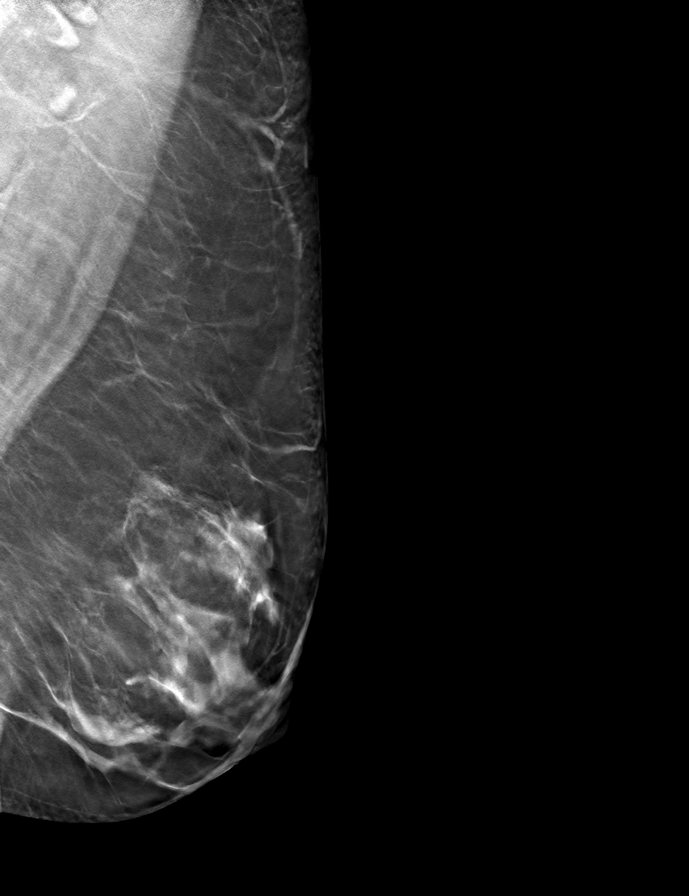
[frame 40/79]
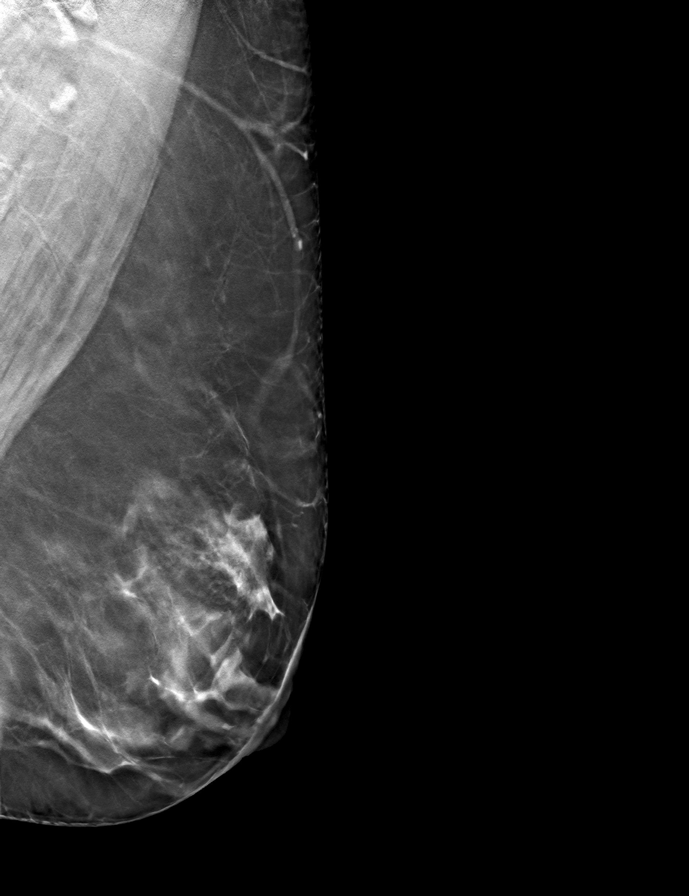

[R MLO tomo · tomo slice 35/69.0]
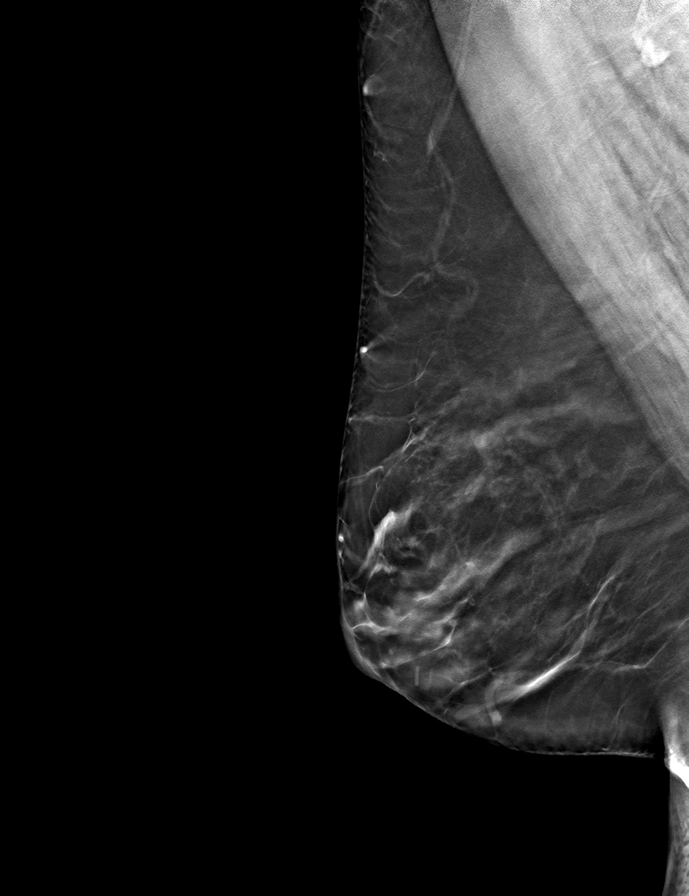

[R CC tomo · tomo slice 31/61.0]
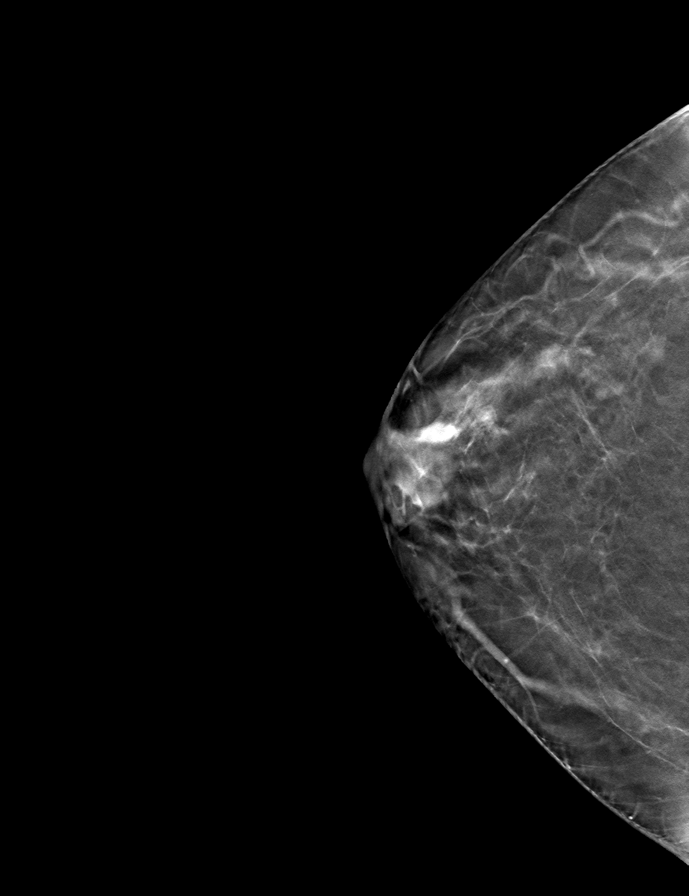

[L CC tomo · tomo slice 38/75.0]
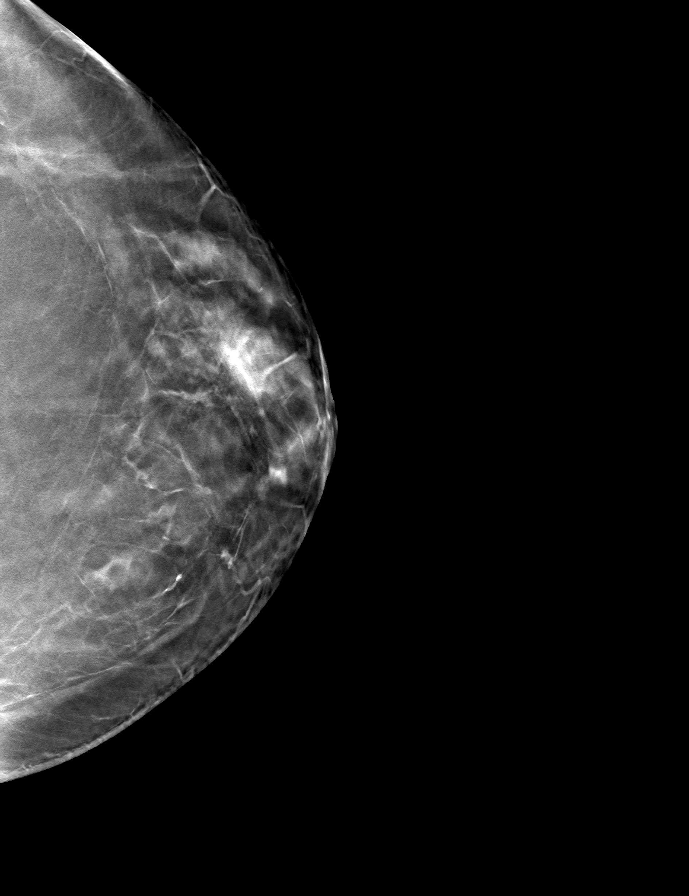

[9 of 24 positions shown; findings below may reference images not displayed]

ACR Breast Density Category b: There are scattered areas of
fibroglandular density.
FINDINGS: There are no findings suspicious for malignancy.
IMPRESSION: No mammographic evidence of malignancy. A result letter of this
screening mammogram will be mailed directly to the patient.

RECOMMENDATION:
Screening mammogram in one year. (Code:51-O-LD2)

BI-RADS CATEGORY  1: Negative.

## 2023-05-05 ENCOUNTER — Encounter: Payer: Self-pay | Admitting: Gastroenterology

## 2023-05-05 ENCOUNTER — Ambulatory Visit (AMBULATORY_SURGERY_CENTER): Payer: BC Managed Care – PPO

## 2023-05-05 VITALS — Ht 65.0 in | Wt 175.0 lb

## 2023-05-05 DIAGNOSIS — Z1211 Encounter for screening for malignant neoplasm of colon: Secondary | ICD-10-CM

## 2023-05-05 MED ORDER — NA SULFATE-K SULFATE-MG SULF 17.5-3.13-1.6 GM/177ML PO SOLN: 1.0000 | Freq: Once | ORAL | 0 refills | Status: AC

## 2023-05-05 NOTE — Progress Notes (Signed)

## 2023-05-19 ENCOUNTER — Ambulatory Visit (AMBULATORY_SURGERY_CENTER): Payer: BC Managed Care – PPO | Admitting: Gastroenterology

## 2023-05-19 ENCOUNTER — Encounter: Payer: Self-pay | Admitting: Gastroenterology

## 2023-05-19 VITALS — BP 134/90 | HR 59 | Temp 98.6°F | Resp 12 | Ht 65.0 in | Wt 175.0 lb

## 2023-05-19 DIAGNOSIS — D123 Benign neoplasm of transverse colon: Secondary | ICD-10-CM | POA: Diagnosis not present

## 2023-05-19 DIAGNOSIS — D120 Benign neoplasm of cecum: Secondary | ICD-10-CM | POA: Diagnosis not present

## 2023-05-19 DIAGNOSIS — Z1211 Encounter for screening for malignant neoplasm of colon: Secondary | ICD-10-CM | POA: Diagnosis present

## 2023-05-19 DIAGNOSIS — K635 Polyp of colon: Secondary | ICD-10-CM | POA: Diagnosis not present

## 2023-05-19 MED ORDER — SODIUM CHLORIDE 0.9 % IV SOLN: 500.0000 mL | Freq: Once | INTRAVENOUS | Status: DC

## 2023-05-19 NOTE — Op Note (Signed)
[facility] Patient Name: Tara Graves Procedure Date: 05/19/2023 8:03 AM MRN: [id number] Endoscopist: Lorin Picket E. Tomasa Rand , MD, [id number] Age: 57 Referring MD:  Date of Birth: 20-Nov-1965 Gender: Female Account #: [id number] Procedure:                Colonoscopy Indications:              Screening for colorectal malignant neoplasm, This                            is the patient's first colonoscopy Medicines:                Monitored Anesthesia Care Procedure:                Pre-Anesthesia Assessment:                           - Prior to the procedure, a History and Physical                            was performed, and patient medications and                            allergies were reviewed. The patient's tolerance of                            previous anesthesia was also reviewed. The risks                            and benefits of the procedure and the sedation                            options and risks were discussed with the patient.                            All questions were answered, and informed consent                            was obtained. Prior Anticoagulants: The patient has                            taken no anticoagulant or antiplatelet agents. ASA                            Grade Assessment: II - A patient with mild systemic                            disease. After reviewing the risks and benefits,                            the patient was deemed in satisfactory condition to                            undergo the procedure.  After obtaining informed consent, the colonoscope                            was passed under direct vision. Throughout the                            procedure, the patient's blood pressure, pulse, and                            oxygen saturations were monitored continuously. The                            CF [id number] #[id number] was introduced through the anus                            and advanced to  the the terminal ileum, with                            identification of the appendiceal orifice and IC                            valve. The colonoscopy was performed without                            difficulty. The patient tolerated the procedure                            well. The quality of the bowel preparation was                            good. The terminal ileum, ileocecal valve,                            appendiceal orifice, and rectum were photographed.                            The bowel preparation used was SUPREP via split                            dose instruction. Scope In: 8:10:06 AM Scope Out: 8:28:08 AM Scope Withdrawal Time: 0 hours 12 minutes 33 seconds  Total Procedure Duration: 0 hours 18 minutes 2 seconds  Findings:                 The perianal and digital rectal examinations were                            normal. Pertinent negatives include normal                            sphincter tone and no palpable rectal lesions.                           A 4 mm polyp was found in the cecum. The polyp was  flat and mucous-capped. The polyp was removed with                            a cold snare. Resection and retrieval were                            complete. Estimated blood loss was minimal.                           A 4 mm polyp was found in the cecum. The polyp was                            flat. The polyp was removed with a cold snare.                            Resection and retrieval were complete. Estimated                            blood loss was minimal.                           A 3 mm polyp was found in the proximal transverse                            colon. The polyp was sessile. The polyp was removed                            with a cold snare. Resection and retrieval were                            complete. Estimated blood loss was minimal.                           The exam was otherwise normal throughout the                             examined colon.                           The terminal ileum appeared normal.                           The retroflexed view of the distal rectum and anal                            verge was normal and showed no anal or rectal                            abnormalities. Complications:            No immediate complications. Estimated Blood Loss:     Estimated blood loss was minimal. Impression:               - One 4 mm polyp in the cecum, removed with a cold  snare. Resected and retrieved.                           - One 4 mm polyp in the cecum, removed with a cold                            snare. Resected and retrieved.                           - One 3 mm polyp in the proximal transverse colon,                            removed with a cold snare. Resected and retrieved.                           - The examined portion of the ileum was normal.                           - The distal rectum and anal verge are normal on                            retroflexion view. Recommendation:           - Patient has a contact number available for                            emergencies. The signs and symptoms of potential                            delayed complications were discussed with the                            patient. Return to normal activities tomorrow.                            Written discharge instructions were provided to the                            patient.                           - Resume previous diet.                           - Continue present medications.                           - Await pathology results.                           - Repeat colonoscopy (date not yet determined) for                            surveillance based on pathology results. Jomarie Gellis E. Tomasa Rand, MD 05/19/2023 8:33:20 AM This report has been signed electronically.

## 2023-05-19 NOTE — Progress Notes (Signed)
Pt's states no medical or surgical changes since previsit or office visit. 

## 2023-05-19 NOTE — Patient Instructions (Addendum)
Continue present medications. Await pathology results. Repeat colonoscopy (date not yet determined) for surveillance based on pathology results.  Please read handout about polyps                       YOU HAD AN ENDOSCOPIC PROCEDURE TODAY AT THE [facility]:   Refer to the procedure report that was given to you for any specific questions about what was found during the examination.  If the procedure report does not answer your questions, please call your gastroenterologist to clarify.  If you requested that your care partner not be given the details of your procedure findings, then the procedure report has been included in a sealed envelope for you to review at your convenience later.  YOU SHOULD EXPECT: Some feelings of bloating in the abdomen. Passage of more gas than usual.  Walking can help get rid of the air that was put into your GI tract during the procedure and reduce the bloating. If you had a lower endoscopy (such as a colonoscopy or flexible sigmoidoscopy) you may notice spotting of blood in your stool or on the toilet paper. If you underwent a bowel prep for your procedure, you may not have a normal bowel movement for a few days.  Please Note:  You might notice some irritation and congestion in your nose or some drainage.  This is from the oxygen used during your procedure.  There is no need for concern and it should clear up in a day or so.  SYMPTOMS TO REPORT IMMEDIATELY:  Following lower endoscopy (colonoscopy or flexible sigmoidoscopy):  Excessive amounts of blood in the stool  Significant tenderness or worsening of abdominal pains  Swelling of the abdomen that is new, acute  Fever of 100F or higher  For urgent or emergent issues, a gastroenterologist can be reached at any hour by calling [phone]. Do not use MyChart messaging for urgent concerns.    DIET:  We do recommend a small meal at first, but then you may proceed to your regular diet.  Drink plenty  of fluids but you should avoid alcoholic beverages for 24 hours.  ACTIVITY:  You should plan to take it easy for the rest of today and you should NOT DRIVE or use heavy machinery until tomorrow (because of the sedation medicines used during the test).    FOLLOW UP: Our staff will call the number listed on your records the next business day following your procedure.  We will call around 7:15- 8:00 am to check on you and address any questions or concerns that you may have regarding the information given to you following your procedure. If we do not reach you, we will leave a message.     If any biopsies were taken you will be contacted by phone or by letter within the next 1-3 weeks.  Please call us at [phone] if you have not heard about the biopsies in 3 weeks.    SIGNATURES/CONFIDENTIALITY: You and/or your care partner have signed paperwork which will be entered into your electronic medical record.  These signatures attest to the fact that that the information above on your After Visit Summary has been reviewed and is understood.  Full responsibility of the confidentiality of this discharge information lies with you and/or your care-partner.

## 2023-05-19 NOTE — Progress Notes (Signed)
[facility] History and Physical   Primary Care Physician:  Erskine Emery, NP   Reason for Procedure:   Colon cancer screening  Plan:    Screening colonoscopy     HPI: Tara Graves is a 57 y.o. female undergoing initial average risk screening colonoscopy.  She has no family history of colon cancer and no chronic GI symptoms.    Past Medical History:  Diagnosis Date   Allergy    Anxiety    Arthritis    bilateral thumbs   Fibromyalgia    GERD (gastroesophageal reflux disease)    History of hiatal hernia    Hyperlipidemia    Hypertension    Interstitial cystitis    Lupus (HCC)    Myofascial muscle pain    chronic   Uterine fibroid     Past Surgical History:  Procedure Laterality Date   ABDOMINAL HYSTERECTOMY     CESAREAN SECTION     CHOLECYSTECTOMY     COLONOSCOPY WITH ESOPHAGOGASTRODUODENOSCOPY (EGD)     FINGER ARTHROSCOPY WITH CARPOMETACARPEL [facility]) ARTHROPLASTY Left 08/17/2018   Procedure: Left thumb carpometacarpal arthroplasty with double tendon transfer and left forearm hardware removal;  Surgeon: Dominica Severin, MD;  Location: [facility] OR;  Service: Orthopedics;  Laterality: Left;  2 hrs General with Block   FRACTURE SURGERY Left    foot   GREAT TOE ARTHRODESIS, INTERPHALANGEAL JOINT Left 2022   THUMB ARTHROSCOPY Right    ULNAR SHORTENING WITH BONE GRAFT Bilateral    WISDOM TOOTH EXTRACTION      Prior to Admission medications   Medication Sig Start Date End Date Taking? Authorizing Provider  famotidine (PEPCID) 40 MG tablet Take 40 mg by mouth at bedtime.   Yes [provider]  fluticasone (FLONASE) 50 MCG/ACT nasal spray Place 1 spray into both nostrils at bedtime.   Yes [provider]  levocetirizine (XYZAL) 5 MG tablet Take 5 mg by mouth at bedtime.   Yes [provider]  methocarbamol (ROBAXIN) 500 MG tablet Take 500 mg by mouth at bedtime.   Yes [provider]  montelukast (SINGULAIR) 10 MG tablet  Take 10 mg by mouth at bedtime.   Yes [provider]  NALTREXONE HCL PO Take 3 mg by mouth at bedtime.   Yes [provider]  Cyanocobalamin (VITAMIN B-12 PO) mecobalamin (vitamin B12) Patient not taking: Reported on 05/19/2023    [provider]  Glycerin-Hypromellose-PEG 400 ([employer] DRY EYE RELIEF OP) Place 1 drop into both eyes daily as needed (for dry eyes).    [provider]  MAGNESIUM PO Take by mouth. Patient not taking: Reported on 05/19/2023    [provider]  naproxen sodium (ALEVE) 220 MG tablet Take 220 mg by mouth.    [provider]  nitroGLYCERIN (NITROGLYN) 2 % ointment Apply 0.5 inches topically See admin instructions. Apply 1 application between toes once daily for 6-8 hours    [provider]    Current Outpatient Medications  Medication Sig Dispense Refill   famotidine (PEPCID) 40 MG tablet Take 40 mg by mouth at bedtime.     fluticasone (FLONASE) 50 MCG/ACT nasal spray Place 1 spray into both nostrils at bedtime.     levocetirizine (XYZAL) 5 MG tablet Take 5 mg by mouth at bedtime.     methocarbamol (ROBAXIN) 500 MG tablet Take 500 mg by mouth at bedtime.     montelukast (SINGULAIR) 10 MG tablet Take 10 mg by mouth at bedtime.  NALTREXONE HCL PO Take 3 mg by mouth at bedtime.     Cyanocobalamin (VITAMIN B-12 PO) mecobalamin (vitamin B12) (Patient not taking: Reported on 05/19/2023)     Glycerin-Hypromellose-PEG 400 ([employer] DRY EYE RELIEF OP) Place 1 drop into both eyes daily as needed (for dry eyes).     MAGNESIUM PO Take by mouth. (Patient not taking: Reported on 05/19/2023)     naproxen sodium (ALEVE) 220 MG tablet Take 220 mg by mouth.     nitroGLYCERIN (NITROGLYN) 2 % ointment Apply 0.5 inches topically See admin instructions. Apply 1 application between toes once daily for 6-8 hours     Current Facility-Administered Medications  Medication Dose Route Frequency Provider Last Rate Last Admin   0.9 %  sodium  chloride infusion  500 mL Intravenous Once Jenel Lucks, MD        Allergies as of 05/19/2023 - Review Complete 05/19/2023  Allergen Reaction Noted   Flagyl [metronidazole] Other (See Comments) 08/03/2018    Family History  Problem Relation Age of Onset   Breast cancer Maternal Aunt    Colon cancer Neg Hx    Colon polyps Neg Hx    Esophageal cancer Neg Hx    Rectal cancer Neg Hx    Stomach cancer Neg Hx     Social History   Socioeconomic History   Marital status: Married    Spouse name: Not on file   Number of children: Not on file   Years of education: Not on file   Highest education level: Not on file  Occupational History   Not on file  Tobacco Use   Smoking status: Former    Types: Cigarettes   Smokeless tobacco: Never  Vaping Use   Vaping status: Never Used  Substance and Sexual Activity   Alcohol use: Not Currently    Alcohol/week: 6.0 standard drinks of alcohol    Types: 6 Cans of beer per week   Drug use: Never   Sexual activity: Not on file  Other Topics Concern   Not on file  Social History Narrative   Not on file   Social Determinants of Health   Financial Resource Strain: Not on file  Food Insecurity: Not on file  Transportation Needs: Not on file  Physical Activity: Not on file  Stress: Not on file  Social Connections: Not on file  Intimate Partner Violence: Not on file    Review of Systems:  All other review of systems negative except as mentioned in the HPI.  Physical Exam: Vital signs BP (!) 155/97   Pulse 71   Temp 98.6 F (37 C) (Temporal)   Resp 11   Ht 5\' 5"  (1.651 m)   Wt 175 lb (79.4 kg)   SpO2 100%   BMI 29.12 kg/m   General:   Alert,  Well-developed, well-nourished, pleasant and cooperative in NAD Airway:  Mallampati 3 Lungs:  Clear throughout to auscultation.   Heart:  Regular rate and rhythm; no murmurs, clicks, rubs,  or gallops. Abdomen:  Soft, nontender and nondistended. Normal bowel sounds.    Neuro/Psych:  Normal mood and affect. A and O x 3   Zinedine Ellner E. Tomasa Rand, MD [facility]

## 2023-05-19 NOTE — Progress Notes (Signed)
Called to room to assist during endoscopic procedure.  Patient ID and intended procedure confirmed with present staff. Received instructions for my participation in the procedure from the performing physician.  

## 2023-05-19 NOTE — Progress Notes (Signed)
Report to PACU, RN, vss, BBS= Clear.  

## 2023-05-23 ENCOUNTER — Telehealth: Payer: Self-pay

## 2023-05-23 NOTE — Telephone Encounter (Signed)
No answer, left message to call if having any issues or concerns, B.Schwartz RN 

## 2023-05-31 NOTE — Progress Notes (Signed)
Tara Graves,   The three polyps that I removed during your recent procedure were completely benign but were proven to be "pre-cancerous" polyps that MAY have grown into cancers if they had not been removed.  Studies shows that at least 20% of women over age 55 and 30% of men over age 60 have pre-cancerous polyps.  Based on current nationally recognized surveillance guidelines, I recommend that you have a repeat colonoscopy in 5 years.   If you develop any new rectal bleeding, abdominal pain or significant bowel habit changes, please contact me before then.

## 2023-07-14 DIAGNOSIS — Z6829 Body mass index (BMI) 29.0-29.9, adult: Secondary | ICD-10-CM | POA: Insufficient documentation

## 2023-07-14 DIAGNOSIS — R7303 Prediabetes: Secondary | ICD-10-CM | POA: Insufficient documentation

## 2023-07-14 DIAGNOSIS — F32A Depression, unspecified: Secondary | ICD-10-CM | POA: Insufficient documentation

## 2023-07-14 DIAGNOSIS — F419 Anxiety disorder, unspecified: Secondary | ICD-10-CM | POA: Insufficient documentation

## 2023-07-14 DIAGNOSIS — G8929 Other chronic pain: Secondary | ICD-10-CM | POA: Insufficient documentation

## 2023-07-14 DIAGNOSIS — N951 Menopausal and female climacteric states: Secondary | ICD-10-CM | POA: Insufficient documentation

## 2024-03-21 ENCOUNTER — Other Ambulatory Visit: Payer: Self-pay | Admitting: Family

## 2024-03-21 DIAGNOSIS — Z1231 Encounter for screening mammogram for malignant neoplasm of breast: Secondary | ICD-10-CM

## 2024-03-28 DIAGNOSIS — G950 Syringomyelia and syringobulbia: Secondary | ICD-10-CM | POA: Insufficient documentation

## 2024-03-31 ENCOUNTER — Other Ambulatory Visit: Payer: Self-pay | Admitting: Medical Genetics

## 2024-04-02 ENCOUNTER — Other Ambulatory Visit: Payer: Self-pay | Admitting: Medical Genetics

## 2024-04-02 DIAGNOSIS — Z006 Encounter for examination for normal comparison and control in clinical research program: Secondary | ICD-10-CM

## 2024-04-05 ENCOUNTER — Ambulatory Visit
Admission: RE | Admit: 2024-04-05 | Discharge: 2024-04-05 | Disposition: A | Discharge status: Home / Self Care | Source: Ambulatory Visit | Attending: Family | Admitting: Family

## 2024-04-05 ENCOUNTER — Other Ambulatory Visit: Payer: Self-pay | Admitting: Neurosurgery

## 2024-04-05 DIAGNOSIS — Z1231 Encounter for screening mammogram for malignant neoplasm of breast: Secondary | ICD-10-CM

## 2024-04-05 DIAGNOSIS — M546 Pain in thoracic spine: Secondary | ICD-10-CM

## 2024-04-15 LAB — GENECONNECT MOLECULAR SCREEN: Genetic Analysis Overall Interpretation: NEGATIVE

## 2024-04-20 ENCOUNTER — Other Ambulatory Visit

## 2024-04-20 ENCOUNTER — Ambulatory Visit
Admission: RE | Admit: 2024-04-20 | Discharge: 2024-04-20 | Disposition: A | Discharge status: Home / Self Care | Source: Ambulatory Visit | Attending: Neurosurgery

## 2024-04-20 DIAGNOSIS — M546 Pain in thoracic spine: Secondary | ICD-10-CM

## 2024-07-02 ENCOUNTER — Ambulatory Visit (HOSPITAL_BASED_OUTPATIENT_CLINIC_OR_DEPARTMENT_OTHER): Admitting: Student

## 2024-07-04 ENCOUNTER — Ambulatory Visit (INDEPENDENT_AMBULATORY_CARE_PROVIDER_SITE_OTHER): Admitting: Student

## 2024-07-04 ENCOUNTER — Telehealth (HOSPITAL_BASED_OUTPATIENT_CLINIC_OR_DEPARTMENT_OTHER): Payer: Self-pay

## 2024-07-04 ENCOUNTER — Encounter (HOSPITAL_BASED_OUTPATIENT_CLINIC_OR_DEPARTMENT_OTHER): Payer: Self-pay

## 2024-07-04 VITALS — BP 145/99 | HR 61 | Temp 97.9°F | Resp 16 | Ht 63.78 in | Wt 150.3 lb

## 2024-07-04 DIAGNOSIS — Z13220 Encounter for screening for lipoid disorders: Secondary | ICD-10-CM

## 2024-07-04 DIAGNOSIS — Z136 Encounter for screening for cardiovascular disorders: Secondary | ICD-10-CM

## 2024-07-04 DIAGNOSIS — F5101 Primary insomnia: Secondary | ICD-10-CM

## 2024-07-04 DIAGNOSIS — I7300 Raynaud's syndrome without gangrene: Secondary | ICD-10-CM

## 2024-07-04 DIAGNOSIS — R7303 Prediabetes: Secondary | ICD-10-CM

## 2024-07-04 DIAGNOSIS — Z7689 Persons encountering health services in other specified circumstances: Secondary | ICD-10-CM

## 2024-07-04 DIAGNOSIS — Z7189 Other specified counseling: Secondary | ICD-10-CM

## 2024-07-04 DIAGNOSIS — M328 Other forms of systemic lupus erythematosus: Secondary | ICD-10-CM

## 2024-07-04 NOTE — Patient Instructions (Addendum)
 It was nice to see you today!  A type of therapy known as CBT-I is considered the first-line treatment for insomnia. It may even be able to help 4/5 individuals get better sleep. At this time I am unaware of any institutions offer CBT-I within our area.  There are some online therapy use which can be both effective for sleep and cost effective as well.  Go! To Sleep- Web-based App-this is a Energy manager therapy platform created by [facility] clinic for patients with insomnia.  Last time checked, the cost was around $40 (pretty cheap for better sleep!).  CBT-I Coach- iOS/Android App- This app provides education about sleep and CBT-I, personalized feedback about your sleep, and an option for sleep diary to track wake and sleep times.  Sleepio-  iOS/Android App and Web-based App- A fairly popular option, may be worth a try!  [facility] Insomnia- (text based)- this option doesn't provide with audio or with an app but likely has some helpful information for you!  If you have any problems before your next visit feel free to message me via MyChart (minor issues or questions) or call the office, otherwise you may reach out to schedule an office visit.  Thank you! Dayle Sherpa, PA-C

## 2024-07-04 NOTE — Progress Notes (Signed)
 New Patient Office Visit  Subjective    Patient ID: Tara Graves, female    DOB: Feb 03, 1966  Age: 58 y.o. MRN: [id number]  CC:  Chief Complaint  Patient presents with   Establish Care    Here to establish care.Needs refill for naltrexone.  Would like to get RSV & PNA vaccine but not today.    Discussed the use of AI scribe software for clinical note transcription with the patient, who gave verbal consent to proceed.  History of Present Illness   Tara Graves is a 58 year old female who presents to establish care and for refill.  She has a history of lupus, diagnosed in her twenties, initially managed with Plaquenil and Imuran. In 2017, she transitioned to low dose naltrexone, which has been reportedly effective. She recalls that her rheumatologist told her she had not had a positive ANA in a long time and released her from care, recommending that she continue naltrexone as long as she remains symptom-free. She seeks a refill as her previous provider is unavailable, will want records of this.  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 Regular follow-ups with an eye doctor have shown no progression since discontinuing Plaquenil.  Menopause-related symptoms, including anxiety and severe insomnia, have been present. Estrogen therapy started earlier this year has significantly improved her sleep, allowing her to sleep eight hours after the first dose. She had a hysterectomy in her thirties due to fibroids discovered during her last pregnancy.  She has undergone hand surgeries, including ulnar shortening, bone grafts, and thumb joint reconstruction in 2017 and 2018. Her gallbladder was removed due to chronic inflammation and infection, providing significant relief.  She is concerned about high cholesterol, which she cannot manage with statins due to muscle issues. She has been taking fish oil inconsistently. She has lost 30 pounds in the past year  and is mindful of her heart health, especially given her father's history of severe heart disease.  She is monitoring her blood sugar levels due to concerns about prediabetes, noting higher fasting blood sugar levels. She has a history of reactive hypoglycemia since childhood and aims to avoid diabetes progression. Her A1c was 5.7 previously, and she is actively managing her diet and weight.  She uses hydroxyzine for sleep, having transitioned from Xanax  used during her hand surgeries. One hydroxyzine at night helps her sleep.  She has a family history of Alzheimer's, with her mother currently experiencing symptoms, and is concerned about potential cognitive effects of her medications. She engages in mind-body work and therapy, including EMDR, to address childhood trauma and improve her well-being.      Outpatient Encounter Medications as of 07/04/2024  Medication Sig   estradiol  (ESTRACE ) 1 MG tablet Take 1 mg by mouth.   famotidine  (PEPCID ) 40 MG tablet Take 40 mg by mouth at bedtime.   fluticasone  (FLONASE ) 50 MCG/ACT nasal spray Place 1 spray into both nostrils at bedtime.   Gamma-Aminobutyric Acid (GABA PO) Take 750 mg by mouth daily.   hydrOXYzine (ATARAX) 25 MG tablet Take by mouth as needed (25-50mf at bedtime as needed).   levocetirizine (XYZAL ) 5 MG tablet Take 5 mg by mouth at bedtime.   methocarbamol  (ROBAXIN ) 500 MG tablet Take 500 mg by mouth at bedtime.   montelukast  (SINGULAIR ) 10 MG tablet Take 10 mg by mouth at bedtime.   NALTREXONE HCL PO Take 3 mg by mouth at bedtime.   naproxen sodium (ALEVE) 220 MG  tablet Take 220 mg by mouth.   nitroGLYCERIN (NITROGLYN) 2 % OINT ointment Apply 1 Application topically as needed.   TART CHERRY PO Take 1,200 mg by mouth daily.   [DISCONTINUED] nitroGLYCERIN (NITROGLYN) 2 % ointment Apply 0.5 inches topically See admin instructions. Apply 1 application between toes once daily for 6-8 hours   [DISCONTINUED] Cyanocobalamin  (VITAMIN B-12 PO)  mecobalamin (vitamin B12) (Patient not taking: Reported on 05/19/2023)   [DISCONTINUED] Glycerin-Hypromellose-PEG 400 ([facility] DRY EYE RELIEF OP) Place 1 drop into both eyes daily as needed (for dry eyes).   [DISCONTINUED] MAGNESIUM PO Take by mouth. (Patient not taking: Reported on 05/19/2023)   No facility-administered encounter medications on file as of 07/04/2024.    Past Medical History:  Diagnosis Date   Allergy    Anxiety    Has improved since Istarted taking  oral estrogen.   Arthritis    bilateral thumbs   Fibromyalgia    GERD (gastroesophageal reflux disease)    History of hiatal hernia    Hyperlipidemia    Hypertension    Has improved with weight loss and stress management.   Interstitial cystitis    Lupus    Myofascial muscle pain    chronic   Uterine fibroid     Past Surgical History:  Procedure Laterality Date   ABDOMINAL HYSTERECTOMY     only uterus was removed   CESAREAN SECTION     x1   CHOLECYSTECTOMY     COLONOSCOPY WITH ESOPHAGOGASTRODUODENOSCOPY (EGD)     FINGER ARTHROSCOPY WITH CARPOMETACARPEL [facility]) ARTHROPLASTY Left 08/17/2018   Procedure: Left thumb carpometacarpal arthroplasty with double tendon transfer and left forearm hardware removal;  Surgeon: Camella Fallow, MD;  Location: [facility] OR;  Service: Orthopedics;  Laterality: Left;  2 hrs General with Block   FRACTURE SURGERY Left    foot   GREAT TOE ARTHRODESIS, INTERPHALANGEAL JOINT Left 2022   THUMB ARTHROSCOPY Right    ULNAR SHORTENING WITH BONE GRAFT Bilateral    WISDOM TOOTH EXTRACTION     x4    Family History  Problem Relation Age of Onset   Arthritis Mother    Depression Mother    Hearing loss Mother    Hyperlipidemia Mother    Vision loss Mother    Osteoporosis Mother    Hiatal hernia Mother    Dementia Mother    Diabetes Father    Heart disease Father    Hyperlipidemia Father    Hypertension Father    Pancreatic cancer Father    Depression Sister    Hiatal hernia Sister     Hyperlipidemia Sister    Hyperlipidemia Sister    Hiatal hernia Sister    Hyperlipidemia Sister    Hypertension Sister    Breast cancer Maternal Aunt    Diabetes Maternal Grandmother    Colon cancer Neg Hx    Colon polyps Neg Hx    Esophageal cancer Neg Hx    Rectal cancer Neg Hx    Stomach cancer Neg Hx     Social History   Socioeconomic History   Marital status: Married    Spouse name: Not on file   Number of children: 2   Years of education: Not on file   Highest education level: Some college, no degree  Occupational History   Not on file  Tobacco Use   Smoking status: Former    Current packs/day: 0.00    Average packs/day: 0.3 packs/day for 5.2 years (1.3 ttl pk-yrs)    Types: Cigarettes  Start date: 79    Quit date: 12/18/1988    Years since quitting: 35.5    Passive exposure: Current   Smokeless tobacco: Never  Vaping Use   Vaping status: Never Used  Substance and Sexual Activity   Alcohol use: Not Currently    Alcohol/week: 6.0 standard drinks of alcohol   Drug use: Never   Sexual activity: Yes    Birth control/protection: Other-see comments    Comment: Hysterectomy  Other Topics Concern   Not on file  Social History Narrative   Not on file   Social Drivers of Health   Financial Resource Strain: Low Risk  (07/04/2024)   Overall Financial Resource Strain (CARDIA)    Difficulty of Paying Living Expenses: Not very hard  Food Insecurity: No Food Insecurity (07/04/2024)   Hunger Vital Sign    Worried About Running Out of Food in the Last Year: Never true    Ran Out of Food in the Last Year: Never true  Transportation Needs: No Transportation Needs (07/04/2024)   PRAPARE - Administrator, Civil Service (Medical): No    Lack of Transportation (Non-Medical): No  Physical Activity: Sufficiently Active (07/04/2024)   Exercise Vital Sign    Days of Exercise per Week: 3 days    Minutes of Exercise per Session: 50 min  Stress: Stress Concern  Present (07/04/2024)   [employer] of Occupational Health - Occupational Stress Questionnaire    Feeling of Stress: To some extent  Social Connections: Socially Isolated (07/04/2024)   Social Connection and Isolation Panel    Frequency of Communication with Friends and Family: Never    Frequency of Social Gatherings with Friends and Family: Once a week    Attends Religious Services: Never    Database administrator or Organizations: No    Attends Engineer, structural: Not on file    Marital Status: Married  Catering manager Violence: Not At Risk (07/04/2024)   Humiliation, Afraid, Rape, and Kick questionnaire    Fear of Current or Ex-Partner: No    Emotionally Abused: No    Physically Abused: No    Sexually Abused: No    ROS  Per HPI      Objective    BP (!) 145/99   Pulse 61   Temp 97.9 F (36.6 C) (Oral)   Resp 16   Ht 5' 3.78 (1.62 m)   Wt 150 lb 4.8 oz (68.2 kg)   SpO2 98%   BMI 25.98 kg/m   Physical Exam Constitutional:      General: She is not in acute distress.    Appearance: Normal appearance. She is not ill-appearing.  HENT:     Head: Normocephalic and atraumatic.     Nose: Nose normal.  Eyes:     General: No scleral icterus.    Conjunctiva/sclera: Conjunctivae normal.  Cardiovascular:     Rate and Rhythm: Normal rate and regular rhythm.     Heart sounds: Normal heart sounds. No murmur heard.    No friction rub.  Pulmonary:     Effort: Pulmonary effort is normal. No respiratory distress.     Breath sounds: Normal breath sounds. No wheezing, rhonchi or rales.  Musculoskeletal:        General: Normal range of motion.     Right lower leg: No edema.     Left lower leg: No edema.  Skin:    General: Skin is warm and dry.     Coloration: Skin is  not jaundiced or pale.  Neurological:     General: No focal deficit present.     Mental Status: She is alert.  Psychiatric:        Mood and Affect: Mood normal.        Behavior: Behavior  normal.     Last CBC Lab Results  Component Value Date   WBC 7.1 08/09/2018   HGB 13.4 08/09/2018   HCT 41.4 08/09/2018   MCV 95.8 08/09/2018   MCH 31.0 08/09/2018   RDW 13.0 08/09/2018   PLT 348 08/09/2018   Last metabolic panel Lab Results  Component Value Date   GLUCOSE 95 08/09/2018   NA 137 08/09/2018   K 4.5 08/09/2018   CL 105 08/09/2018   CO2 24 08/09/2018   BUN 15 08/09/2018   CREATININE 0.81 08/09/2018   GFRNONAA >60 08/09/2018   CALCIUM 9.3 08/09/2018   PROT 6.8 08/09/2018   ALBUMIN 4.3 08/09/2018   BILITOT 0.6 08/09/2018   ALKPHOS 72 08/09/2018   AST 26 08/09/2018   ALT 17 08/09/2018   ANIONGAP 8 08/09/2018   Last lipids No results found for: CHOL, HDL, LDLCALC, LDLDIRECT, TRIG, CHOLHDL Last hemoglobin A1c No results found for: HGBA1C      Assessment & Plan:   Assessment and Plan    Systemic lupus erythematosus (SLE) Chronic, stable. Long-standing SLE per pt, apparently well-controlled with low dose naltrexone. No recent flares. Previous treatment with Plaquenil resulted in mild macular damage, now stable. No current rheumatology follow-up, state she was stable and thus dismissed from practice. - Continue low dose naltrexone 3 mg daily- I will want records before refilling this. - Will obtain medical records from previous rheumatologist and pharmacy for naltrexone prescription history.  Prediabetes Chronic, stable. A1c of 5.7%. Recent weight loss of 30 pounds. Fasting blood glucose levels trending upwards, with postprandial spikes. Concerns about progression to diabetes. Previous use of continuous glucose monitor, now using finger sticks. - Ordered fasting A1c to assess current status. - Will consider metformin if A1c indicates progression towards diabetes. - Discussed potential use of Noom for psychological support in weight management.  Hyperlipidemia Chronic, not at goal. Elevated cholesterol levels. Statins not tolerated due to  muscle pain and trigger points. Concerns about cardiovascular risk due to family history of heart disease. Discussed alternative lipid-lowering therapies including benpedoic acid and Zetia. Coronary calcium score discussed as a non-invasive method to assess cardiovascular risk. - Ordered coronary calcium score to assess cardiovascular risk. - Will consider benpedoic acid or Zetia for lipid management if indicated.  Hypertension Chronic, suspected white coat due to good readings at home. Blood pressure slightly elevated in clinic, but typically well-controlled at home. Recent weight loss and increased physical activity noted. No immediate need for medication adjustment. - Rechecked blood pressure at the end of the visit. - Bring home blood pressure cuff to next visit for validation.  Insomnia Chronic insomnia, previously managed with Xanax , now using hydroxyzine. Concerns about long-term use of hydroxyzine due to potential cognitive effects. Discussed alternative sleep hygiene practices and potential future medication adjustments. - Continue hydroxyzine for sleep as needed. - Consider alternative sleep hygiene practices such as relaxation techniques and magnesium glycinate if magnesium levels allow.     Return in about 2 months (around 09/09/2024) for Chronic Followup.   Jatara Huettner T Dereona Kolodny, PA-C

## 2024-07-05 NOTE — Telephone Encounter (Signed)
 See mychart msg

## 2024-07-10 ENCOUNTER — Ambulatory Visit (INDEPENDENT_AMBULATORY_CARE_PROVIDER_SITE_OTHER)
Admission: RE | Admit: 2024-07-10 | Discharge: 2024-07-10 | Disposition: A | Payer: Self-pay | Source: Ambulatory Visit | Attending: Student | Admitting: Radiology

## 2024-07-10 DIAGNOSIS — Z7189 Other specified counseling: Secondary | ICD-10-CM

## 2024-07-12 ENCOUNTER — Ambulatory Visit (HOSPITAL_BASED_OUTPATIENT_CLINIC_OR_DEPARTMENT_OTHER): Payer: Self-pay | Admitting: Student

## 2024-07-18 ENCOUNTER — Other Ambulatory Visit (HOSPITAL_BASED_OUTPATIENT_CLINIC_OR_DEPARTMENT_OTHER)

## 2024-07-18 DIAGNOSIS — M328 Other forms of systemic lupus erythematosus: Secondary | ICD-10-CM

## 2024-07-18 DIAGNOSIS — R7303 Prediabetes: Secondary | ICD-10-CM

## 2024-07-18 DIAGNOSIS — Z13220 Encounter for screening for lipoid disorders: Secondary | ICD-10-CM

## 2024-07-19 LAB — CBC WITH DIFFERENTIAL/PLATELET
Basophils Absolute: 0.1 x10E3/uL (ref 0.0–0.2)
Basos: 1 %
EOS (ABSOLUTE): 0.1 x10E3/uL (ref 0.0–0.4)
Eos: 2 %
Hematocrit: 41.3 % (ref 34.0–46.6)
Hemoglobin: 13.6 g/dL (ref 11.1–15.9)
Immature Grans (Abs): 0 x10E3/uL (ref 0.0–0.1)
Immature Granulocytes: 0 %
Lymphocytes Absolute: 1.8 x10E3/uL (ref 0.7–3.1)
Lymphs: 40 %
MCH: 31.2 pg (ref 26.6–33.0)
MCHC: 32.9 g/dL (ref 31.5–35.7)
MCV: 95 fL (ref 79–97)
Monocytes Absolute: 0.4 x10E3/uL (ref 0.1–0.9)
Monocytes: 9 %
Neutrophils Absolute: 2.2 x10E3/uL (ref 1.4–7.0)
Neutrophils: 48 %
Platelets: 309 x10E3/uL (ref 150–450)
RBC: 4.36 x10E6/uL (ref 3.77–5.28)
RDW: 12 % (ref 11.7–15.4)
WBC: 4.5 x10E3/uL (ref 3.4–10.8)

## 2024-07-19 LAB — LIPID PANEL
Chol/HDL Ratio: 5 ratio — ABNORMAL HIGH (ref 0.0–4.4)
Cholesterol, Total: 194 mg/dL (ref 100–199)
HDL: 39 mg/dL — ABNORMAL LOW (ref >39)
LDL Chol Calc (NIH): 138 mg/dL — ABNORMAL HIGH (ref 0–99)
Triglycerides: 91 mg/dL (ref 0–149)
VLDL Cholesterol Cal: 17 mg/dL (ref 5–40)

## 2024-07-19 LAB — COMPREHENSIVE METABOLIC PANEL WITH GFR
ALT: 16 IU/L (ref 0–32)
AST: 25 IU/L (ref 0–40)
Albumin: 4.2 g/dL (ref 3.8–4.9)
Alkaline Phosphatase: 71 IU/L (ref 49–135)
BUN/Creatinine Ratio: 24 — ABNORMAL HIGH (ref 9–23)
BUN: 20 mg/dL (ref 6–24)
Bilirubin Total: 0.2 mg/dL (ref 0.0–1.2)
CO2: 24 mmol/L (ref 20–29)
Calcium: 8.9 mg/dL (ref 8.7–10.2)
Chloride: 102 mmol/L (ref 96–106)
Creatinine, Ser: 0.85 mg/dL (ref 0.57–1.00)
Globulin, Total: 2.1 g/dL (ref 1.5–4.5)
Glucose: 76 mg/dL (ref 70–99)
Potassium: 4.4 mmol/L (ref 3.5–5.2)
Sodium: 140 mmol/L (ref 134–144)
Total Protein: 6.3 g/dL (ref 6.0–8.5)
eGFR: 80 mL/min/1.73

## 2024-07-19 LAB — HEMOGLOBIN A1C
Est. average glucose Bld gHb Est-mCnc: 103 mg/dL
Hgb A1c MFr Bld: 5.2 % (ref 4.8–5.6)

## 2024-07-25 ENCOUNTER — Other Ambulatory Visit (HOSPITAL_BASED_OUTPATIENT_CLINIC_OR_DEPARTMENT_OTHER): Payer: Self-pay | Admitting: Student

## 2024-07-25 DIAGNOSIS — R52 Pain, unspecified: Secondary | ICD-10-CM

## 2024-07-25 DIAGNOSIS — M328 Other forms of systemic lupus erythematosus: Secondary | ICD-10-CM

## 2024-07-25 MED ORDER — NONFORMULARY OR COMPOUNDED ITEM: 1 refills | Status: AC

## 2024-07-26 ENCOUNTER — Other Ambulatory Visit (HOSPITAL_BASED_OUTPATIENT_CLINIC_OR_DEPARTMENT_OTHER): Payer: Self-pay

## 2024-09-09 ENCOUNTER — Ambulatory Visit (HOSPITAL_BASED_OUTPATIENT_CLINIC_OR_DEPARTMENT_OTHER): Admitting: Student

## 2024-09-09 ENCOUNTER — Encounter (HOSPITAL_BASED_OUTPATIENT_CLINIC_OR_DEPARTMENT_OTHER): Payer: Self-pay | Admitting: Student

## 2024-09-09 VITALS — BP 120/78 | HR 62 | Temp 98.2°F | Resp 16 | Ht 63.78 in

## 2024-09-09 DIAGNOSIS — M62830 Muscle spasm of back: Secondary | ICD-10-CM | POA: Diagnosis not present

## 2024-09-09 DIAGNOSIS — J302 Other seasonal allergic rhinitis: Secondary | ICD-10-CM

## 2024-09-09 DIAGNOSIS — M797 Fibromyalgia: Secondary | ICD-10-CM

## 2024-09-09 DIAGNOSIS — K219 Gastro-esophageal reflux disease without esophagitis: Secondary | ICD-10-CM | POA: Diagnosis not present

## 2024-09-09 DIAGNOSIS — I7300 Raynaud's syndrome without gangrene: Secondary | ICD-10-CM | POA: Diagnosis not present

## 2024-09-09 MED ORDER — LEVOCETIRIZINE DIHYDROCHLORIDE 5 MG PO TABS: 5.0000 mg | ORAL_TABLET | Freq: Every day | ORAL | 3 refills | Status: AC

## 2024-09-09 MED ORDER — METHOCARBAMOL 500 MG PO TABS: 500.0000 mg | ORAL_TABLET | Freq: Every day | ORAL | 1 refills | Status: AC

## 2024-09-09 MED ORDER — MONTELUKAST SODIUM 10 MG PO TABS: 10.0000 mg | ORAL_TABLET | Freq: Every day | ORAL | 3 refills | Status: AC

## 2024-09-09 MED ORDER — FAMOTIDINE 40 MG PO TABS: 40.0000 mg | ORAL_TABLET | Freq: Every day | ORAL | 3 refills | Status: AC

## 2024-09-09 NOTE — Patient Instructions (Signed)
 It was nice to see you today!  If you have any problems before your next visit feel free to message me via MyChart (minor issues or questions) or call the office, otherwise you may reach out to schedule an office visit.  Thank you! Pau Banh, PA-C

## 2024-09-09 NOTE — Progress Notes (Unsigned)
 "  Established Patient Office Visit  Subjective   Patient ID: Tara Graves, female    DOB: 1966/09/14  Age: 58 y.o. MRN: [id number]  Chief Complaint  Patient presents with   Medical Management of Chronic Issues    Follow up.    HPI  Discussed the use of AI scribe software for clinical note transcription with the patient, who gave verbal consent to proceed.  History of Present Illness   Tara Graves is a 58 year old female with lupus and fibromyalgia who presents for follow-up on low dose naltrexone therapy. She is accompanied by her husband, Medford.  She she continues to maintain significant reductions in chronic pain on low dose naltrexone. She has been able to engage in activities such as hiking with a 4,000-foot elevation gain.  She has a history of Raynaud's phenomenon affecting her fingers and toes, for which she uses nitroglycerin ointment as needed, especially in cold weather. Her previous supply of nitroglycerin ointment has expired and was ineffective this year.  She takes hydroxyzine, which she has been able to reduce significantly. She is concerned about potential cognitive effects due to her family history of dementia and Alzheimer's disease. Her mother recently passed away from dementia, and her grandmother also had Alzheimer's.  She uses methocarbamol  for muscle spasms related to back issues and a tight pelvic floor, noting that attempts to discontinue the medication worsened her pelvic floor symptoms.  She takes Xyzal  for persistent allergies and Pepcid  for reflux. She also uses nitroglycerin ointment for Raynaud's phenomenon, applying it between her toes as needed to prevent ulceration.  She has been dealing with significant personal stress, including the recent passing of her mother and managing her estate. She has a history of a difficult relationship with her mother, who was a narcissist, and has been seeing a therapist to help process these events. No thoughts  of self-harm or harm to others.      Patient Active Problem List   Diagnosis Date Noted   Primary insomnia 07/04/2024   Syringomyelia (HCC) 03/28/2024   Anxiety disorder 07/14/2023   Body mass index (BMI) 29.0-29.9, adult 07/14/2023   Chronic pain 07/14/2023   Menopausal syndrome 07/14/2023   Prediabetes 07/14/2023   Hypervitaminosis D 07/04/2022   Raynaud's phenomenon 05/02/2020   Unilateral primary osteoarthritis of first carpometacarpal joint, left hand 05/08/2018   Fibromyalgia 09/01/2014   Generalized osteoarthritis of multiple sites 09/01/2014   Rosacea 09/01/2014   Systemic lupus erythematosus (HCC) 09/01/2014   Vitamin D deficiency 12/29/2011   Past Medical History:  Diagnosis Date   Allergy    Anxiety    Has improved since Istarted taking  oral estrogen.   Arthritis    bilateral thumbs   Fibromyalgia    GERD (gastroesophageal reflux disease)    History of hiatal hernia    Hyperlipidemia    Hypertension    Has improved with weight loss and stress management.   Interstitial cystitis    Lupus    Myofascial muscle pain    chronic   Uterine fibroid    Social History[1] Allergies[2]    ROS Per HPI.    Objective:     BP 120/78 Comment: MANUAL BP  Pulse 62   Temp 98.2 F (36.8 C) (Oral)   Resp 16   Ht 5' 3.78 (1.62 m)   SpO2 98%   BMI 25.98 kg/m  BP Readings from Last 3 Encounters:  09/09/24 120/78  07/04/24 (!) 145/99  05/19/23 (!) 134/90  Wt Readings from Last 3 Encounters:  07/04/24 150 lb 4.8 oz (68.2 kg)  05/19/23 175 lb (79.4 kg)  05/05/23 175 lb (79.4 kg)   SpO2 Readings from Last 3 Encounters:  09/09/24 98%  07/04/24 98%  05/19/23 99%      Physical Exam Constitutional:      General: She is not in acute distress.    Appearance: Normal appearance. She is not ill-appearing.  HENT:     Head: Normocephalic and atraumatic.     Nose: Nose normal.  Eyes:     General: No scleral icterus.    Conjunctiva/sclera: Conjunctivae  normal.  Cardiovascular:     Rate and Rhythm: Normal rate and regular rhythm.     Heart sounds: Normal heart sounds. No murmur heard.    No friction rub.  Pulmonary:     Effort: Pulmonary effort is normal. No respiratory distress.     Breath sounds: Normal breath sounds. No wheezing, rhonchi or rales.  Musculoskeletal:        General: Normal range of motion.  Skin:    General: Skin is warm and dry.     Coloration: Skin is not jaundiced or pale.  Neurological:     General: No focal deficit present.     Mental Status: She is alert.  Psychiatric:        Mood and Affect: Mood normal.        Behavior: Behavior normal.      No results found for any visits on 09/09/24.  Last CBC Lab Results  Component Value Date   WBC 4.5 07/18/2024   HGB 13.6 07/18/2024   HCT 41.3 07/18/2024   MCV 95 07/18/2024   MCH 31.2 07/18/2024   RDW 12.0 07/18/2024   PLT 309 07/18/2024   Last metabolic panel Lab Results  Component Value Date   GLUCOSE 76 07/18/2024   NA 140 07/18/2024   K 4.4 07/18/2024   CL 102 07/18/2024   CO2 24 07/18/2024   BUN 20 07/18/2024   CREATININE 0.85 07/18/2024   EGFR 80 07/18/2024   CALCIUM 8.9 07/18/2024   PROT 6.3 07/18/2024   ALBUMIN 4.2 07/18/2024   LABGLOB 2.1 07/18/2024   BILITOT 0.2 07/18/2024   ALKPHOS 71 07/18/2024   AST 25 07/18/2024   ALT 16 07/18/2024   ANIONGAP 8 08/09/2018   Last lipids Lab Results  Component Value Date   CHOL 194 07/18/2024   HDL 39 (L) 07/18/2024   LDLCALC 138 (H) 07/18/2024   TRIG 91 07/18/2024   CHOLHDL 5.0 (H) 07/18/2024   Last hemoglobin A1c Lab Results  Component Value Date   HGBA1C 5.2 07/18/2024      The 10-year ASCVD risk score (Arnett DK, et al., 2019) is: 2.6%    Assessment & Plan:   Assessment and Plan  Fibromyalgia Chronic, stable.  Symptoms are well-managed with low dose naltrexone. No current fibromyalgia pain reported. Previous treatments with antidepressants were ineffective and caused  severe side effects. - Continue low dose naltrexone.  Raynaud's phenomenon Affects fingers and toes, with a history of near ulceration on the big toe. Nitroglycerin ointment is used as needed for cold exposure. Previous ointment packets expired and were ineffective this year. - Prescribed nitroglycerin ointment in a tube form. - Instructed to check current nitroglycerin ointment concentration at home and report back.  Chronic back pain with back muscle spasm Chronic, stable.  Managed with methocarbamol . Spasms are associated with a back issue and pelvic floor tightness. Methocarbamol  is effective  in managing symptoms. - Continue methocarbamol  for back spasms-refill  Seasonal allergic rhinitis Chronic, stable.  Chronic allergic rhinitis with symptoms present year-round. Suspected cat allergy despite negative testing. - Continue Xyzal  for allergy management-refill  Gastroesophageal reflux disease Chronic, stable.  Managed with Pepcid  as a proton pump inhibitor. - Continue Pepcid  for GERD management-refill     Return in about 5 months (around 02/07/2025) for Annual Physical.    Lang T Niambi Smoak, PA-C     [1]  Social History Tobacco Use   Smoking status: Former    Current packs/day: 0.00    Average packs/day: 0.3 packs/day for 5.2 years (1.3 ttl pk-yrs)    Types: Cigarettes    Start date: 32    Quit date: 12/18/1988    Years since quitting: 35.7    Passive exposure: Current   Smokeless tobacco: Never  Vaping Use   Vaping status: Never Used  Substance Use Topics   Alcohol use: Not Currently   Drug use: Never  [2]  Allergies Allergen Reactions   Flagyl [Metronidazole] Other (See Comments)    Very sick   "

## 2024-09-10 DIAGNOSIS — K219 Gastro-esophageal reflux disease without esophagitis: Secondary | ICD-10-CM | POA: Insufficient documentation

## 2024-09-10 DIAGNOSIS — J302 Other seasonal allergic rhinitis: Secondary | ICD-10-CM | POA: Insufficient documentation

## 2024-09-13 ENCOUNTER — Encounter (HOSPITAL_BASED_OUTPATIENT_CLINIC_OR_DEPARTMENT_OTHER): Payer: Self-pay | Admitting: Student

## 2024-09-16 ENCOUNTER — Other Ambulatory Visit (HOSPITAL_BASED_OUTPATIENT_CLINIC_OR_DEPARTMENT_OTHER): Payer: Self-pay | Admitting: Student

## 2024-09-16 DIAGNOSIS — I7300 Raynaud's syndrome without gangrene: Secondary | ICD-10-CM

## 2024-09-16 MED ORDER — NITRO-BID 2 % TD OINT: TOPICAL_OINTMENT | TRANSDERMAL | 0 refills | Status: AC

## 2025-02-06 ENCOUNTER — Encounter (HOSPITAL_BASED_OUTPATIENT_CLINIC_OR_DEPARTMENT_OTHER): Admitting: Student
